# Patient Record
Sex: Male | Born: 1965 | Race: Black or African American | Hispanic: No | Marital: Single | Smoking: Never smoker
Health system: Southern US, Community
[De-identification: ages and names within clinical notes are randomized; demographics above are authoritative.]

## PROBLEM LIST (undated history)

## (undated) DIAGNOSIS — R112 Nausea with vomiting, unspecified: Secondary | ICD-10-CM

## (undated) DIAGNOSIS — Z9889 Other specified postprocedural states: Secondary | ICD-10-CM

---

## 2001-09-04 ENCOUNTER — Emergency Department (HOSPITAL_COMMUNITY): Admission: EM | Admit: 2001-09-04 | Discharge: 2001-09-04 | Payer: Self-pay | Admitting: *Deleted

## 2006-09-09 ENCOUNTER — Ambulatory Visit (HOSPITAL_COMMUNITY): Admission: RE | Admit: 2006-09-09 | Discharge: 2006-09-09 | Payer: Self-pay | Admitting: Urology

## 2011-08-02 ENCOUNTER — Ambulatory Visit (HOSPITAL_COMMUNITY)
Admission: RE | Admit: 2011-08-02 | Discharge: 2011-08-02 | Disposition: A | Discharge status: Home / Self Care | Payer: 59 | Source: Ambulatory Visit | Attending: Internal Medicine | Admitting: Internal Medicine

## 2011-08-02 ENCOUNTER — Other Ambulatory Visit (HOSPITAL_COMMUNITY): Payer: Self-pay | Admitting: Internal Medicine

## 2011-08-02 DIAGNOSIS — Z0000 Encounter for general adult medical examination without abnormal findings: Secondary | ICD-10-CM

## 2011-08-02 DIAGNOSIS — R0602 Shortness of breath: Secondary | ICD-10-CM

## 2012-12-17 IMAGING — CR DG CHEST 2V
2 series · 2 of 2 positions shown · non-contrast
Comparison: None.

CLINICAL DATA: Shortness of breath and congestion

CHEST - 2 VIEW

[view not recorded (1 of 2)]
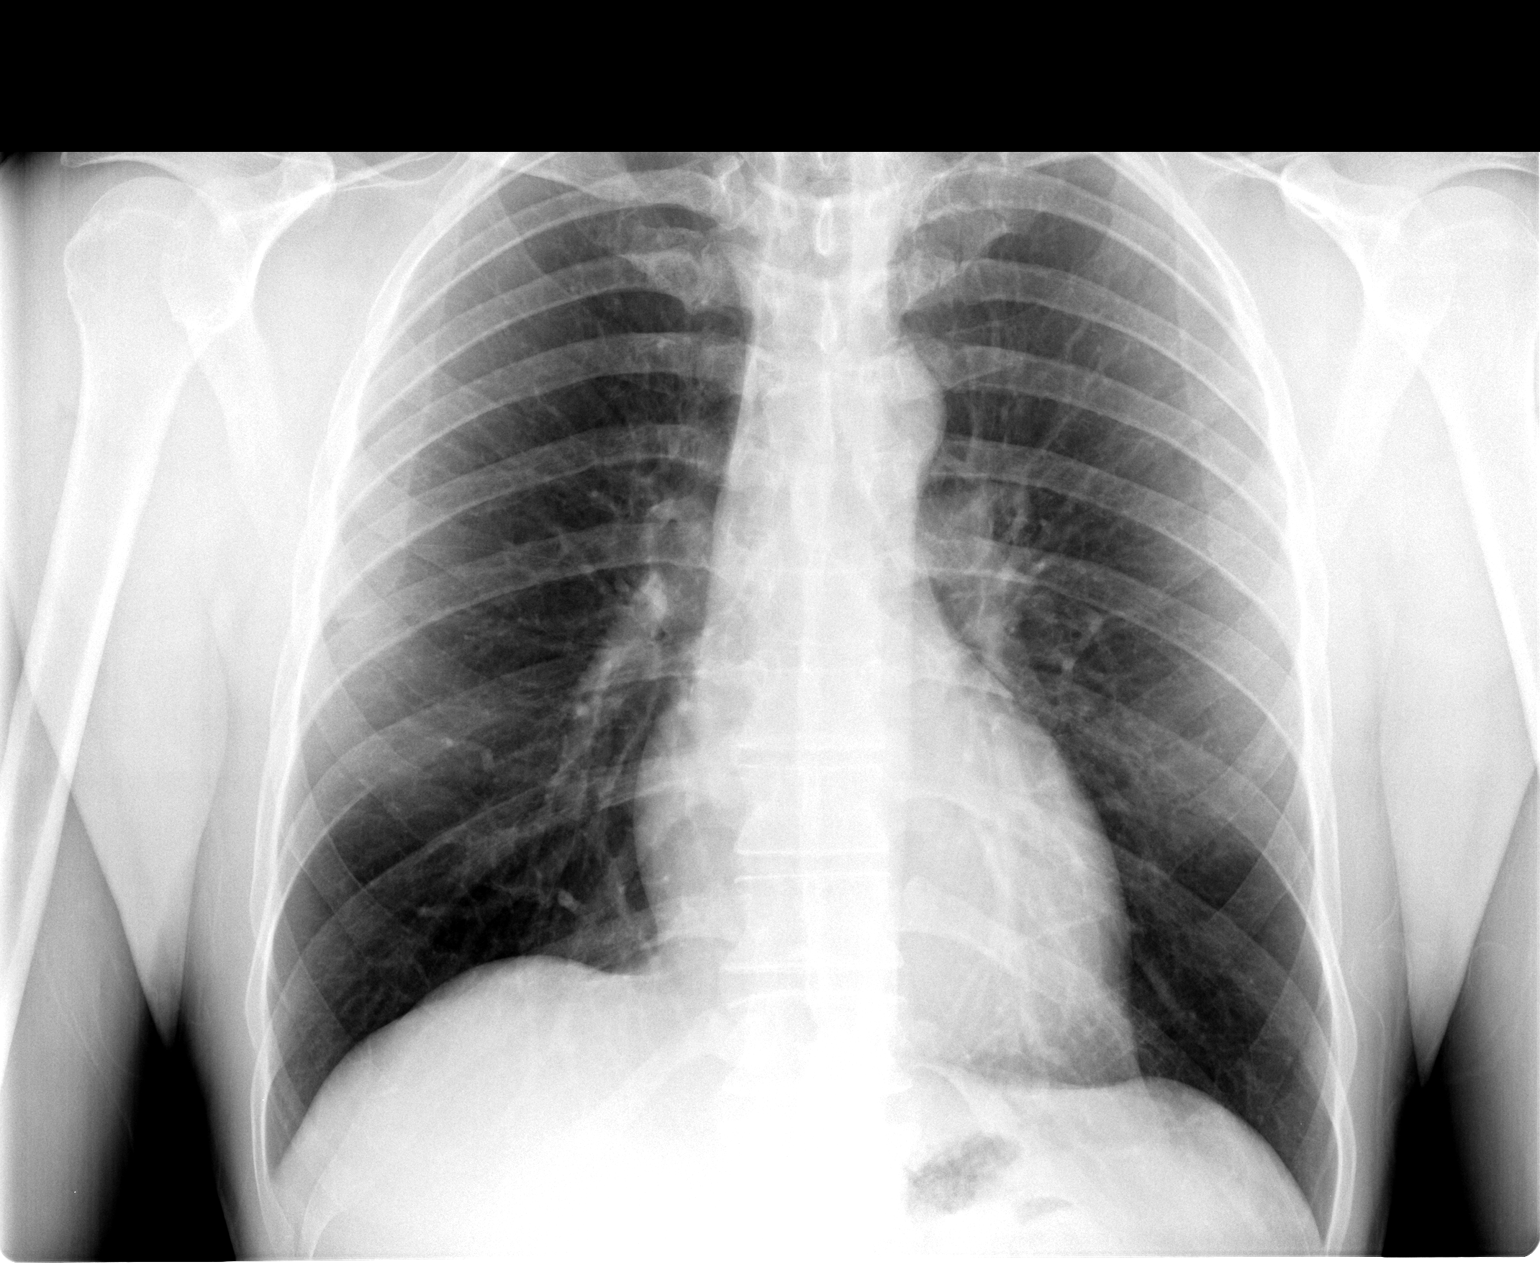

[view not recorded (2 of 2)]
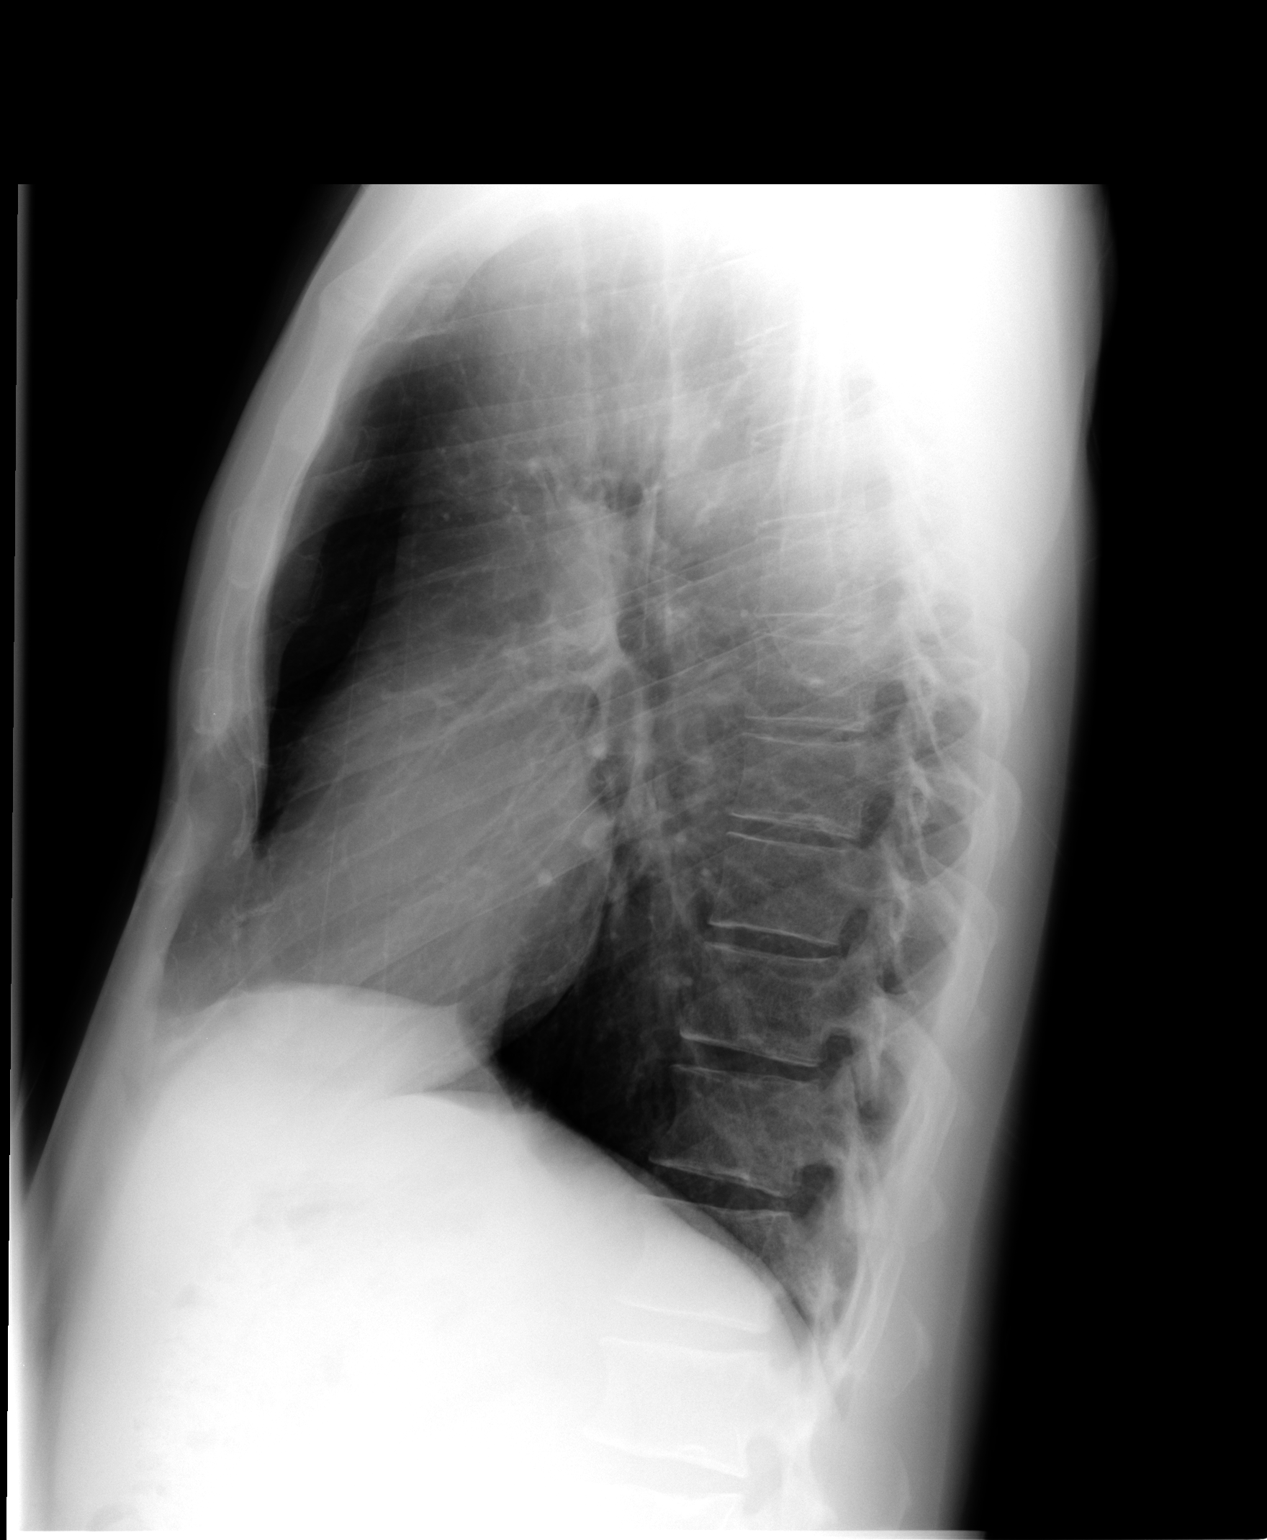

[2 of 2 positions shown; findings below may reference images not displayed]

FINDINGS: Heart and mediastinal contours are within normal limits.
The lung fields are clear with no signs of focal infiltrate or
congestive failure.  No pleural fluid or peribronchial cuffing is
seen.

Bony structures appear intact.
IMPRESSION: No worrisome focal or acute cardiopulmonary abnormality noted.

## 2015-11-17 ENCOUNTER — Encounter (INDEPENDENT_AMBULATORY_CARE_PROVIDER_SITE_OTHER): Payer: Self-pay | Admitting: *Deleted

## 2016-08-30 ENCOUNTER — Encounter (INDEPENDENT_AMBULATORY_CARE_PROVIDER_SITE_OTHER): Payer: Self-pay | Admitting: *Deleted

## 2016-08-30 ENCOUNTER — Encounter (INDEPENDENT_AMBULATORY_CARE_PROVIDER_SITE_OTHER): Payer: Self-pay

## 2018-01-23 DIAGNOSIS — Z6823 Body mass index (BMI) 23.0-23.9, adult: Secondary | ICD-10-CM | POA: Diagnosis not present

## 2018-01-23 DIAGNOSIS — J301 Allergic rhinitis due to pollen: Secondary | ICD-10-CM | POA: Diagnosis not present

## 2018-03-13 DIAGNOSIS — Z6822 Body mass index (BMI) 22.0-22.9, adult: Secondary | ICD-10-CM | POA: Diagnosis not present

## 2018-03-13 DIAGNOSIS — Z0000 Encounter for general adult medical examination without abnormal findings: Secondary | ICD-10-CM | POA: Diagnosis not present

## 2019-08-27 ENCOUNTER — Other Ambulatory Visit: Payer: Self-pay | Admitting: Internal Medicine

## 2019-08-27 ENCOUNTER — Other Ambulatory Visit: Payer: Self-pay

## 2019-08-27 DIAGNOSIS — Z20822 Contact with and (suspected) exposure to covid-19: Secondary | ICD-10-CM

## 2019-08-29 ENCOUNTER — Telehealth: Payer: Self-pay

## 2019-08-29 LAB — NOVEL CORONAVIRUS, NAA: SARS-CoV-2, NAA: NOT DETECTED

## 2019-08-29 NOTE — Telephone Encounter (Signed)
Assisted pt with MyChart enrollment. 

## 2019-09-02 LAB — NOVEL CORONAVIRUS, NAA: SARS-CoV-2, NAA: NOT DETECTED

## 2020-06-09 ENCOUNTER — Other Ambulatory Visit: Payer: Self-pay

## 2020-06-09 DIAGNOSIS — Z20822 Contact with and (suspected) exposure to covid-19: Secondary | ICD-10-CM

## 2020-06-10 LAB — SARS-COV-2, NAA 2 DAY TAT

## 2020-06-10 LAB — NOVEL CORONAVIRUS, NAA: SARS-CoV-2, NAA: NOT DETECTED

## 2021-08-21 ENCOUNTER — Encounter: Payer: Self-pay | Admitting: *Deleted

## 2021-09-06 ENCOUNTER — Other Ambulatory Visit: Payer: Self-pay

## 2021-09-06 ENCOUNTER — Ambulatory Visit (INDEPENDENT_AMBULATORY_CARE_PROVIDER_SITE_OTHER): Payer: Self-pay | Admitting: *Deleted

## 2021-09-06 ENCOUNTER — Other Ambulatory Visit: Payer: Self-pay | Admitting: *Deleted

## 2021-09-06 ENCOUNTER — Encounter: Payer: Self-pay | Admitting: *Deleted

## 2021-09-06 VITALS — Ht 70.0 in | Wt 162.8 lb

## 2021-09-06 DIAGNOSIS — Z1211 Encounter for screening for malignant neoplasm of colon: Secondary | ICD-10-CM

## 2021-09-06 MED ORDER — CLENPIQ 10-3.5-12 MG-GM -GM/160ML PO SOLN: 1.0000 | Freq: Once | ORAL | 0 refills | Status: AC

## 2021-09-06 NOTE — Progress Notes (Addendum)
Gastroenterology Pre-Procedure Review  Request Date: 09/06/2021 Requesting Physician: Cristino Martes, FNP-C, no previous TCS  PATIENT REVIEW QUESTIONS: The patient responded to the following health history questions as indicated:    1. Diabetes Melitis: no 2. Joint replacements in the past 12 months: no 3. Major health problems in the past 3 months: no 4. Has an artificial valve or MVP: no 5. Has a defibrillator: no 6. Has been advised in past to take antibiotics in advance of a procedure like teeth cleaning: no 7. Family history of colon cancer: no 8. Alcohol Use: no 9. Illicit drug Use: no 10. History of sleep apnea: no 11. History of coronary artery or other vascular stents placed within the last 12 months: no 12. History of any prior anesthesia complications: no 13. Body mass index is 23.36 kg/m.    MEDICATIONS & ALLERGIES:    Patient reports the following regarding taking any blood thinners:   Plavix? no Aspirin? no Coumadin? no Brilinta? no Xarelto? no Eliquis? no Pradaxa? no Savaysa? no Effient? no  Patient confirms/reports the following medications:  Current Outpatient Medications  Medication Sig Dispense Refill   atorvastatin (LIPITOR) 20 MG tablet Take 20 mg by mouth daily.     fexofenadine (ALLEGRA) 180 MG tablet Take 180 mg by mouth as needed for allergies or rhinitis. Rotates with Claritin     fluticasone (FLONASE) 50 MCG/ACT nasal spray Place into both nostrils as needed for allergies or rhinitis.     loratadine (CLARITIN) 10 MG tablet Take 10 mg by mouth as needed for allergies. Rotates with Allegra     sildenafil (VIAGRA) 50 MG tablet as needed.     No current facility-administered medications for this visit.    Patient confirms/reports the following allergies:  No Known Allergies  No orders of the defined types were placed in this encounter.   AUTHORIZATION INFORMATION Primary Insurance: [place],  [place] #:[id number],  Group #: [id number] Pre-Cert / Berkley Harvey  required: Yes, approved per [facility] 25/02/2705-12/08/7626 Pre-Cert / Auth #: [id number]  SCHEDULE INFORMATION: Procedure has been scheduled as follows:  Date: 09/13/2021, Time: 2:00  Location: [facility] with Dr. Jena Gauss  This Gastroenterology Pre-Precedure Review Form is being routed to the following provider(s): Tana Coast, PA-C

## 2021-09-06 NOTE — Progress Notes (Signed)
Ok to schedule conscious sedation. ASA II.  °

## 2021-09-13 ENCOUNTER — Encounter (HOSPITAL_COMMUNITY)
Admission: RE | Disposition: A | Discharge status: Home / Self Care | Payer: Self-pay | Source: Home / Self Care | Attending: Internal Medicine

## 2021-09-13 ENCOUNTER — Encounter (HOSPITAL_COMMUNITY): Payer: Self-pay | Admitting: Internal Medicine

## 2021-09-13 ENCOUNTER — Ambulatory Visit (HOSPITAL_COMMUNITY)
Admission: RE | Admit: 2021-09-13 | Discharge: 2021-09-13 | Disposition: A | Discharge status: Home / Self Care | Payer: 59 | Attending: Internal Medicine | Admitting: Internal Medicine

## 2021-09-13 ENCOUNTER — Other Ambulatory Visit: Payer: Self-pay

## 2021-09-13 DIAGNOSIS — K5730 Diverticulosis of large intestine without perforation or abscess without bleeding: Secondary | ICD-10-CM | POA: Insufficient documentation

## 2021-09-13 DIAGNOSIS — Z1211 Encounter for screening for malignant neoplasm of colon: Secondary | ICD-10-CM | POA: Diagnosis present

## 2021-09-13 HISTORY — PX: COLONOSCOPY: SHX5424

## 2021-09-13 HISTORY — DX: Other specified postprocedural states: Z98.890

## 2021-09-13 HISTORY — DX: Other specified postprocedural states: R11.2

## 2021-09-13 SURGERY — COLONOSCOPY
Anesthesia: Moderate Sedation

## 2021-09-13 MED ORDER — ONDANSETRON HCL 4 MG/2ML IJ SOLN
INTRAMUSCULAR | Status: AC
  Filled 2021-09-13: qty 2

## 2021-09-13 MED ORDER — MEPERIDINE HCL 100 MG/ML IJ SOLN
INTRAMUSCULAR | Status: DC | PRN
  Administered 2021-09-13: 15 mg via INTRAVENOUS
  Administered 2021-09-13: 25 mg via INTRAVENOUS

## 2021-09-13 MED ORDER — MIDAZOLAM HCL 5 MG/5ML IJ SOLN
INTRAMUSCULAR | Status: DC | PRN
  Administered 2021-09-13 (×2): 2 mg via INTRAVENOUS
  Administered 2021-09-13: 1 mg via INTRAVENOUS

## 2021-09-13 MED ORDER — STERILE WATER FOR IRRIGATION IR SOLN
Status: DC | PRN
  Administered 2021-09-13: 60 mL

## 2021-09-13 MED ORDER — MIDAZOLAM HCL 5 MG/5ML IJ SOLN
INTRAMUSCULAR | Status: AC
  Filled 2021-09-13: qty 10

## 2021-09-13 MED ORDER — MEPERIDINE HCL 50 MG/ML IJ SOLN
INTRAMUSCULAR | Status: AC
  Filled 2021-09-13: qty 1

## 2021-09-13 MED ORDER — SODIUM CHLORIDE 0.9 % IV SOLN
INTRAVENOUS | Status: DC
  Administered 2021-09-13: 1000 mL via INTRAVENOUS

## 2021-09-13 MED ORDER — ONDANSETRON HCL 4 MG/2ML IJ SOLN
INTRAMUSCULAR | Status: DC | PRN
  Administered 2021-09-13: 4 mg via INTRAVENOUS

## 2021-09-13 NOTE — Discharge Instructions (Signed)
  Colonoscopy Discharge Instructions  Read the instructions outlined below and refer to this sheet in the next few weeks. These discharge instructions provide you with general information on caring for yourself after you leave the hospital. Your doctor may also give you specific instructions. While your treatment has been planned according to the most current medical practices available, unavoidable complications occasionally occur. If you have any problems or questions after discharge, call Dr. Jena Gauss at [phone]. ACTIVITY You may resume your regular activity, but move at a slower pace for the next 24 hours.  Take frequent rest periods for the next 24 hours.  Walking will help get rid of the air and reduce the bloated feeling in your belly (abdomen).  No driving for 24 hours (because of the medicine (anesthesia) used during the test).   Do not sign any important legal documents or operate any machinery for 24 hours (because of the anesthesia used during the test).  NUTRITION Drink plenty of fluids.  You may resume your normal diet as instructed by your doctor.  Begin with a light meal and progress to your normal diet. Heavy or fried foods are harder to digest and may make you feel sick to your stomach (nauseated).  Avoid alcoholic beverages for 24 hours or as instructed.  MEDICATIONS You may resume your normal medications unless your doctor tells you otherwise.  WHAT YOU CAN EXPECT TODAY Some feelings of bloating in the abdomen.  Passage of more gas than usual.  Spotting of blood in your stool or on the toilet paper.  IF YOU HAD POLYPS REMOVED DURING THE COLONOSCOPY: No aspirin products for 7 days or as instructed.  No alcohol for 7 days or as instructed.  Eat a soft diet for the next 24 hours.  FINDING OUT THE RESULTS OF YOUR TEST Not all test results are available during your visit. If your test results are not back during the visit, make an appointment with your caregiver to find out the  results. Do not assume everything is normal if you have not heard from your caregiver or the medical facility. It is important for you to follow up on all of your test results.  SEEK IMMEDIATE MEDICAL ATTENTION IF: You have more than a spotting of blood in your stool.  Your belly is swollen (abdominal distention).  You are nauseated or vomiting.  You have a temperature over 101.  You have abdominal pain or discomfort that is severe or gets worse throughout the day.       No polyps found today.  You do have mild diverticulosis  Information on diverticulosis provided   a repeat colonoscopy in 10 years is recommended for screening

## 2021-09-13 NOTE — H&P (Signed)
@  WUJW@   Primary Care Physician:  Benita Stabile, MD Primary Gastroenterologist:  Dr. Jena Gauss  Pre-Procedure History & Physical: HPI:  Jeremiah Fleming is a 55 y.o. male is here for a screening colonoscopy.   No bowel symptoms.  No family history of colon cancer.  No prior colonoscopy.  No past medical history on file.  Prior to Admission medications   Medication Sig Start Date End Date Taking? Authorizing Provider  acetaminophen (TYLENOL) 500 MG tablet Take 1,000 mg by mouth every 6 (six) hours as needed (dental pain/irritation.).   Yes [provider]  atorvastatin (LIPITOR) 20 MG tablet Take 20 mg by mouth every evening. 08/11/21  Yes [provider]  fexofenadine (ALLEGRA) 180 MG tablet Take 180 mg by mouth as needed for allergies or rhinitis. Rotates with Claritin   Yes [provider]  fluticasone (FLONASE) 50 MCG/ACT nasal spray Place into both nostrils as needed for allergies or rhinitis.   Yes [provider]  loratadine (CLARITIN) 10 MG tablet Take 10 mg by mouth as needed for allergies. Rotates with Allegra   Yes [provider]  naphazoline-pheniramine (ALLERGY EYE) 0.025-0.3 % ophthalmic solution Place 1 drop into both eyes 4 (four) times daily as needed for eye irritation.    [provider]  sildenafil (VIAGRA) 50 MG tablet Take 50 mg by mouth daily as needed for erectile dysfunction. 04/08/21   [provider]    Allergies as of 09/06/2021   (No Known Allergies)    No family history on file.  Social History   Socioeconomic History   Marital status: Single    Spouse name: Not on file   Number of children: Not on file   Years of education: Not on file   Highest education level: Not on file  Occupational History   Not on file  Tobacco Use   Smoking status: Not on file   Smokeless tobacco: Not on file  Substance and Sexual Activity   Alcohol use: Not on file   Drug use: Not on file   Sexual activity: Not on  file  Other Topics Concern   Not on file  Social History Narrative   Not on file   Social Determinants of Health   Financial Resource Strain: Not on file  Food Insecurity: Not on file  Transportation Needs: Not on file  Physical Activity: Not on file  Stress: Not on file  Social Connections: Not on file  Intimate Partner Violence: Not on file    Review of Systems: See HPI, otherwise negative ROS  Physical Exam: There were no vitals taken for this visit. General:   Alert,  Well-developed, well-nourished, pleasant and cooperative in NAD Lungs:  Clear throughout to auscultation.   No wheezes, crackles, or rhonchi. No acute distress. Heart:  Regular rate and rhythm; no murmurs, clicks, rubs,  or gallops. Abdomen:  Soft, nontender and nondistended. No masses, hepatosplenomegaly or hernias noted. Normal bowel sounds, without guarding, and without rebound.   Impression/Plan: Jeremiah Fleming is now here to undergo a screening colonoscopy.    First ever average risk screening examination.  Risks, benefits, limitations, imponderables and alternatives regarding colonoscopy have been reviewed with the patient. Questions have been answered. All parties agreeable.     Notice:  This dictation was prepared with Dragon dictation along with smaller phrase technology. Any transcriptional errors that result from this process are unintentional and may not be corrected upon review.

## 2021-09-13 NOTE — Op Note (Signed)
[facility] Patient Name: Jeremiah Fleming Procedure Date: 09/13/2021 12:55 PM MRN: [id number] Date of Birth: 1965-12-09 Attending MD: Gennette Pac , MD CSN: [id number] Age: 55 Admit Type: Outpatient Procedure:                Colonoscopy Indications:              Screening for colorectal malignant neoplasm Providers:                Gennette Pac, MD, Buel Ream. Thomasena Edis RN, RN,                            Durwin Glaze Tech, Technician Referring MD:              Medicines:                Midazolam 4 mg IV, Meperidine 40 mg IV Complications:            No immediate complications. Estimated Blood Loss:     Estimated blood loss was minimal. Procedure:                Pre-Anesthesia Assessment:                           - Prior to the procedure, a History and Physical                            was performed, and patient medications and                            allergies were reviewed. The patient's tolerance of                            previous anesthesia was also reviewed. The risks                            and benefits of the procedure and the sedation                            options and risks were discussed with the patient.                            All questions were answered, and informed consent                            was obtained. Prior Anticoagulants: The patient has                            taken no previous anticoagulant or antiplatelet                            agents. ASA Grade Assessment: II - A patient with                            mild systemic disease. After reviewing the risks  and benefits, the patient was deemed in                            satisfactory condition to undergo the procedure.                           After obtaining informed consent, the colonoscope                            was passed under direct vision. Throughout the                            procedure, the patient's blood pressure, pulse,  and                            oxygen saturations were monitored continuously. The                            [id number]) scope was introduced through                            the anus and advanced to the the cecum, identified                            by appendiceal orifice and ileocecal valve. The                            colonoscopy was performed without difficulty. The                            patient tolerated the procedure well. Anatomical                            landmarks were photographed. The entire colon was                            well visualized. Scope In: 1:32:31 PM Scope Out: 1:45:27 PM Scope Withdrawal Time: 0 hours 7 minutes 34 seconds  Total Procedure Duration: 0 hours 12 minutes 56 seconds  Findings:      The perianal and digital rectal examinations were normal.      Scattered medium-mouthed diverticula were found in the sigmoid colon and       descending colon.      The exam was otherwise without abnormality on direct and retroflexion       views. Impression:               - Diverticulosis in the sigmoid colon and in the                            descending colon.                           - The examination was otherwise normal on direct  and retroflexion views.                           - No specimens collected. Moderate Sedation:      Moderate (conscious) sedation was administered by the endoscopy nurse       and supervised by the endoscopist. The following parameters were       monitored: oxygen saturation, heart rate, blood pressure, respiratory       rate, EKG, adequacy of pulmonary ventilation, and response to care.       Total physician intraservice time was 15 minutes. Recommendation:           - Patient has a contact number available for                            emergencies. The signs and symptoms of potential                            delayed complications were discussed with the                             patient. Return to normal activities tomorrow.                            Written discharge instructions were provided to the                            patient.                           - Resume previous diet.                           - Continue present medications.                           - Repeat colonoscopy in 10 years for screening                            purposes.                           - Return to GI office (date not yet determined). Procedure Code(s):        --- Professional ---                           [phone], Colonoscopy, flexible; diagnostic, including                            collection of specimen(s) by brushing or washing,                            when performed (separate procedure)                           G0500, Moderate sedation services provided by the  same physician or other qualified health care                            professional performing a gastrointestinal                            endoscopic service that sedation supports,                            requiring the presence of an independent trained                            observer to assist in the monitoring of the                            patient's level of consciousness and physiological                            status; initial 15 minutes of intra-service time;                            patient age 54 years or older (additional time may                            be reported with 41638, as appropriate) Diagnosis Code(s):        --- Professional ---                           Z12.11, Encounter for screening for malignant                            neoplasm of colon                           K57.30, Diverticulosis of large intestine without                            perforation or abscess without bleeding CPT copyright 2019 American Medical Association. All rights reserved. The codes documented in this report are preliminary and upon coder review may  be  revised to meet current compliance requirements. Gerrit Friends. Aldo Sondgeroth, MD Gennette Pac, MD 09/13/2021 1:54:42 PM This report has been signed electronically. Number of Addenda: 0

## 2021-09-18 ENCOUNTER — Encounter (HOSPITAL_COMMUNITY): Payer: Self-pay | Admitting: Internal Medicine

## 2023-03-28 ENCOUNTER — Ambulatory Visit (HOSPITAL_COMMUNITY): Payer: 59 | Attending: Family Medicine | Admitting: Occupational Therapy

## 2023-03-28 ENCOUNTER — Other Ambulatory Visit: Payer: Self-pay

## 2023-03-28 DIAGNOSIS — M25512 Pain in left shoulder: Secondary | ICD-10-CM | POA: Insufficient documentation

## 2023-03-28 DIAGNOSIS — G8929 Other chronic pain: Secondary | ICD-10-CM | POA: Diagnosis present

## 2023-03-28 DIAGNOSIS — M25612 Stiffness of left shoulder, not elsewhere classified: Secondary | ICD-10-CM | POA: Diagnosis present

## 2023-03-28 DIAGNOSIS — R29898 Other symptoms and signs involving the musculoskeletal system: Secondary | ICD-10-CM | POA: Insufficient documentation

## 2023-03-28 NOTE — Patient Instructions (Signed)

## 2023-03-28 NOTE — Therapy (Signed)
OUTPATIENT OCCUPATIONAL THERAPY ORTHO EVALUATION  Patient Name: Jeremiah Fleming MRN: [id number] DOB:10-18-1966, 57 y.o., male Today's Date: 03/28/2023  PCP: Nita Sells, MD REFERRING PROVIDER: Lupita Raider, NP  END OF SESSION:  OT End of Session - 03/28/23 1814     Visit Number 1    Number of Visits 7    Date for OT Re-Evaluation 05/10/23    Authorization Type [employer], copay $30    OT Start Time 1525    OT Stop Time 1604    OT Time Calculation (min) 39 min    Activity Tolerance Patient tolerated treatment well    Behavior During Therapy WFL for tasks assessed/performed             Past Medical History:  Diagnosis Date   PONV (postoperative nausea and vomiting)    Past Surgical History:  Procedure Laterality Date   COLONOSCOPY N/A 09/13/2021   Procedure: COLONOSCOPY;  Surgeon: Corbin Ade, MD;  Location: [facility];  Service: Endoscopy;  Laterality: N/A;  2:00   There are no problems to display for this patient.   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 Has not followed up with an orthopedic yet. No significant PMH.   PRECAUTIONS: None  WEIGHT BEARING RESTRICTIONS: No  PAIN:  Are you having pain? No  FALLS: Has patient fallen in last 6 months? No  LIVING ENVIRONMENT: Lives with: lives alone Lives in: House/apartment  PLOF: Independent  PATIENT GOALS: Pain relief  NEXT MD VISIT: None  OBJECTIVE:   HAND DOMINANCE: Right  ADLs: Overall ADLs: Dressing and bathing is painful,  difficulty putting on socks, Lifting more than 5lbs is difficult, limiting his ability to cook, clean, or complete work related tasks.   FUNCTIONAL OUTCOME MEASURES: FOTO: 70.12  UPPER EXTREMITY ROM:     Active ROM Left eval  Shoulder flexion 132  Shoulder abduction 136  Shoulder internal rotation 90  Shoulder external rotation 35  (Blank rows = not tested)  UPPER EXTREMITY MMT:     MMT Left eval  Shoulder flexion 4+/5  Shoulder abduction 4/5  Shoulder adduction 4+/5  Shoulder extension 4/5  Shoulder internal rotation 4+/5  Shoulder external rotation 4/5  (Blank rows = not tested)  SENSATION: WFL  EDEMA: No swelling noted  OBSERVATIONS: moderate fascial restrictions noted in the biceps, scapula region, and trapezius   TODAY'S TREATMENT:  DATE: 03/28/23: Evaluation Only    PATIENT EDUCATION: Education details: A/ROM Person educated: Patient Education method: Programmer, multimedia, Facilities manager, and Handouts Education comprehension: verbalized understanding and returned demonstration  HOME EXERCISE PROGRAM: 5/23: A/ROM  GOALS: Goals reviewed with patient? Yes  SHORT TERM GOALS: Target date: 05/10/23  Pt will be provided and educated on HEP for LUE shoulder mobility during ADL and IADL completion.   Goal status: INITIAL  2.  Pt will decrease pain in LUE to 2/10 on average in order to sleep 3+ consecutive hours without waking due to pain.   Goal status: INITIAL  3.  Pt will decrease fascial restrictions in LUE to trace amounts in order to complete overhead reaching tasks.   Goal status: INITIAL  4.  Pt will increase A/ROM to 155+ in flexion and abduction, as well as 45+ in external rotation in order to reach overhead and behind back during dressing and bathing tasks with no limitations.   Goal status: INITIAL  5.  Pt will increase LUE  strength to 5/5 in order to complete lifting task required during meal preparation/housework/yard work.   Goal status: INITIAL   ASSESSMENT:  CLINICAL IMPRESSION: Patient is a 57 y.o. male who was seen today for occupational therapy evaluation for chronic left shoulder pain. Pt reports this pain is limiting him with ADL's and IADL's, specifically when resistance or weight is applied.    PERFORMANCE DEFICITS: in functional skills including ADLs, IADLs, ROM, strength, pain, fascial restrictions, Gross motor control, body mechanics, and UE functional use.  IMPAIRMENTS: are limiting patient from ADLs, IADLs, rest and sleep, work, leisure, and social participation.   COMORBIDITIES: has no other co-morbidities that affects occupational performance. Patient will benefit from skilled OT to address above impairments and improve overall function.  MODIFICATION OR ASSISTANCE TO COMPLETE EVALUATION: No modification of tasks or assist necessary to complete an evaluation.  OT OCCUPATIONAL PROFILE AND HISTORY: Problem focused assessment: Including review of records relating to presenting problem.  CLINICAL DECISION MAKING: LOW - limited treatment options, no task modification necessary  REHAB POTENTIAL: Excellent  EVALUATION COMPLEXITY: Low      PLAN:  OT FREQUENCY: 1x/week  OT DURATION: 6 weeks  PLANNED INTERVENTIONS: self care/ADL training, therapeutic exercise, therapeutic activity, manual therapy, passive range of motion, functional mobility training, electrical stimulation, moist heat, cryotherapy, patient/family education, coping strategies training, and DME and/or AE instructions  RECOMMENDED OTHER SERVICES: N/A  CONSULTED AND AGREED WITH PLAN OF CARE: Patient  PLAN FOR NEXT SESSION: Manual Therapy as needed, AA/ROM, A/ROM, wall slides, Isometrics, Proximal shoulder exercises   Trish Mage, OTR/L [facility] [phone] [place] Rosemarie Beath, OT 03/28/2023, 6:15  PM

## 2023-04-05 ENCOUNTER — Encounter (HOSPITAL_COMMUNITY): Payer: Self-pay | Admitting: Occupational Therapy

## 2023-04-05 ENCOUNTER — Ambulatory Visit (HOSPITAL_COMMUNITY): Payer: 59 | Admitting: Occupational Therapy

## 2023-04-05 DIAGNOSIS — M25512 Pain in left shoulder: Secondary | ICD-10-CM | POA: Diagnosis not present

## 2023-04-05 DIAGNOSIS — G8929 Other chronic pain: Secondary | ICD-10-CM

## 2023-04-05 DIAGNOSIS — R29898 Other symptoms and signs involving the musculoskeletal system: Secondary | ICD-10-CM

## 2023-04-05 DIAGNOSIS — M25612 Stiffness of left shoulder, not elsewhere classified: Secondary | ICD-10-CM

## 2023-04-05 NOTE — Therapy (Signed)
OUTPATIENT OCCUPATIONAL THERAPY ORTHO TREATMENT NOTE  Patient Name: Jeremiah Fleming MRN: [id number] DOB:Jan 17, 1966, 57 y.o., male Today's Date: 04/05/2023  PCP: Nita Sells, MD REFERRING PROVIDER: Lupita Raider, NP  END OF SESSION:  OT End of Session - 04/05/23 0818     Visit Number 2    Number of Visits 7    Date for OT Re-Evaluation 05/10/23    Authorization Type [employer], copay $30    OT Start Time 0737    OT Stop Time 0818    OT Time Calculation (min) 41 min    Activity Tolerance Patient tolerated treatment well    Behavior During Therapy [facility] for tasks assessed/performed              Past Medical History:  Diagnosis Date   PONV (postoperative nausea and vomiting)    Past Surgical History:  Procedure Laterality Date   COLONOSCOPY N/A 09/13/2021   Procedure: COLONOSCOPY;  Surgeon: Corbin Ade, MD;  [facility]: [facility];  Service: Endoscopy;  Laterality: N/A;  2:00   There are no problems to display for this patient.   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 Has not followed up with an orthopedic yet. No significant PMH.   PRECAUTIONS: None  WEIGHT BEARING RESTRICTIONS: No  PAIN:  Are you having pain? No  FALLS: Has patient fallen in last 6 months? No  LIVING ENVIRONMENT: Lives with: lives alone Lives in: House/apartment  PLOF: Independent  PATIENT GOALS: Pain relief  NEXT MD VISIT: None  OBJECTIVE:   HAND DOMINANCE: Right  ADLs: Overall ADLs: Dressing and bathing is  painful, difficulty putting on socks, Lifting more than 5lbs is difficult, limiting his ability to cook, clean, or complete work related tasks.   FUNCTIONAL OUTCOME MEASURES: FOTO: 70.12  UPPER EXTREMITY ROM:     Active ROM Left eval  Shoulder flexion 132  Shoulder abduction 136  Shoulder internal rotation 90  Shoulder external rotation 35  (Blank rows = not tested)  UPPER EXTREMITY MMT:     MMT Left eval  Shoulder flexion 4+/5  Shoulder abduction 4/5  Shoulder adduction 4+/5  Shoulder extension 4/5  Shoulder internal rotation 4+/5  Shoulder external rotation 4/5  (Blank rows = not tested)  SENSATION: WFL  EDEMA: No swelling noted  OBSERVATIONS: moderate fascial restrictions noted in the biceps, scapula region, and trapezius   TODAY'S TREATMENT:  DATE:   04/05/23 -manual therapy: myofascial release and trigger point applied to biceps, trapezius, scapula, and subscapularis to reduce fascial restrictions and pain in order to improve ROM -A/ROM: 2lb dumbbells, supine, flexion, abduction, protraction, horizontal abduction, er/IR, x10 -Wall Slides: flexion, abduction, x10 -Ball on the wall: vertical, horizontal, circles both directions, x10 each -Scapular Strengthening: green band, extension, retraction, rows, x10   PATIENT EDUCATION: Education details: Publishing rights manager Person educated: Patient Education method: Explanation, Demonstration, and Handouts Education comprehension: verbalized understanding and returned demonstration  HOME EXERCISE PROGRAM: 5/23: A/ROM 5/31: Scapular Strengthening  GOALS: Goals reviewed with patient? Yes  SHORT TERM GOALS: Target date: 05/10/23  Pt will be provided and educated on HEP for LUE shoulder mobility during ADL and IADL completion.   Goal status: IN PROGRESS  2.  Pt will decrease pain in LUE to  2/10 on average in order to sleep 3+ consecutive hours without waking due to pain.   Goal status: IN PROGRESS  3.  Pt will decrease fascial restrictions in LUE to trace amounts in order to complete overhead reaching tasks.   Goal status: IN PROGRESS  4.  Pt will increase A/ROM to 155+ in flexion and abduction, as well as 45+ in external rotation in order to reach overhead and behind back during dressing and bathing tasks with no limitations.   Goal status: IN PROGRESS  5.  Pt will increase LUE strength to 5/5 in order to complete lifting task required during meal preparation/housework/yard work.   Goal status: IN PROGRESS   ASSESSMENT:  CLINICAL IMPRESSION: Pt presenting to therapy for his first treatment session. He continues to have moderate fascial restrictions in the LUE addressed with manual therapy this session. Additionally, he continues to work on his ROM and correct movement pattern, while adding light weights for strengthening. OT initiated scapular strengthening this session for improving shoulder stability and strength from the accessory muscles. Verbal and visual cuing throughout session for positioning and technique.   PERFORMANCE DEFICITS: in functional skills including ADLs, IADLs, ROM, strength, pain, fascial restrictions, Gross motor control, body mechanics, and UE functional use.   PLAN:  OT FREQUENCY: 1x/week  OT DURATION: 6 weeks  PLANNED INTERVENTIONS: self care/ADL training, therapeutic exercise, therapeutic activity, manual therapy, passive range of motion, functional mobility training, electrical stimulation, moist heat, cryotherapy, patient/family education, coping strategies training, and DME and/or AE instructions  RECOMMENDED OTHER SERVICES: N/A  CONSULTED AND AGREED WITH PLAN OF CARE: Patient  PLAN FOR NEXT SESSION: Manual Therapy as needed, AA/ROM, A/ROM, wall slides, Isometrics, Proximal shoulder exercises   Trish Mage, OTR/L [facility]  Outpatient Rehab [phone] Shayona Hibbitts Rosemarie Beath, OT 04/05/2023, 8:19 AM

## 2023-04-05 NOTE — Patient Instructions (Signed)

## 2023-04-11 ENCOUNTER — Encounter (HOSPITAL_COMMUNITY): Payer: Self-pay | Admitting: Occupational Therapy

## 2023-04-11 ENCOUNTER — Ambulatory Visit (HOSPITAL_COMMUNITY): Payer: 59 | Attending: Family Medicine | Admitting: Occupational Therapy

## 2023-04-11 DIAGNOSIS — R29898 Other symptoms and signs involving the musculoskeletal system: Secondary | ICD-10-CM | POA: Diagnosis present

## 2023-04-11 DIAGNOSIS — M25612 Stiffness of left shoulder, not elsewhere classified: Secondary | ICD-10-CM | POA: Diagnosis present

## 2023-04-11 DIAGNOSIS — G8929 Other chronic pain: Secondary | ICD-10-CM | POA: Diagnosis present

## 2023-04-11 DIAGNOSIS — M25512 Pain in left shoulder: Secondary | ICD-10-CM | POA: Insufficient documentation

## 2023-04-11 NOTE — Therapy (Signed)
OUTPATIENT OCCUPATIONAL THERAPY ORTHO TREATMENT NOTE  Patient Name: Jeremiah Fleming MRN: [id number] DOB:January 29, 1966, 57 y.o., male Today's Date: 04/11/2023  PCP: Nita Sells, MD REFERRING PROVIDER: Lupita Raider, NP  END OF SESSION:  OT End of Session - 04/11/23 1519     Visit Number 3    Number of Visits 7    Date for OT Re-Evaluation 05/10/23    Authorization Type [employer], copay $30    OT Start Time 1429    OT Stop Time 1510    OT Time Calculation (min) 41 min    Activity Tolerance Patient tolerated treatment well    Behavior During Therapy WFL for tasks assessed/performed               Past Medical History:  Diagnosis Date   PONV (postoperative nausea and vomiting)    Past Surgical History:  Procedure Laterality Date   COLONOSCOPY N/A 09/13/2021   Procedure: COLONOSCOPY;  Surgeon: Corbin Ade, MD;  [facility];  Service: Endoscopy;  Laterality: N/A;  2:00   There are no problems to display for this patient.   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 Has not followed up with an orthopedic yet. No significant PMH.   PRECAUTIONS: None  WEIGHT BEARING RESTRICTIONS: No  PAIN:  Are you having pain? No  FALLS: Has patient fallen in last 6 months? No  LIVING ENVIRONMENT: Lives with: lives alone Lives in: House/apartment  PLOF: Independent  PATIENT GOALS: Pain relief  NEXT MD VISIT: None  OBJECTIVE:   HAND DOMINANCE: Right  ADLs: Overall ADLs: Dressing and bathing is painful, difficulty  putting on socks, Lifting more than 5lbs is difficult, limiting his ability to cook, clean, or complete work related tasks.   FUNCTIONAL OUTCOME MEASURES: FOTO: 70.12  UPPER EXTREMITY ROM:     Active ROM Left eval  Shoulder flexion 132  Shoulder abduction 136  Shoulder internal rotation 90  Shoulder external rotation 35  (Blank rows = not tested)  UPPER EXTREMITY MMT:     MMT Left eval  Shoulder flexion 4+/5  Shoulder abduction 4/5  Shoulder adduction 4+/5  Shoulder extension 4/5  Shoulder internal rotation 4+/5  Shoulder external rotation 4/5  (Blank rows = not tested)  OBSERVATIONS: moderate fascial restrictions noted in the biceps, scapula region, and trapezius   TODAY'S TREATMENT:  DATE:   04/11/23 -manual therapy: myofascial release and trigger point applied to biceps, trapezius, scapula, and subscapularis to reduce fascial restrictions and pain in order to improve ROM -P/ROM: supine-flexion, abduction, er, horizontal abduction, 5 reps -AA/ROM: supine-protraction, flexion, er, 10 reps -A/ROM: supine-horizontal abduction,  -Shoulder stretches: flexion, abduction, doorway stretch, cross chest stretch, er stretch, IR stretch behind back with horizontal towel, 2x20" holds -Scapular Strengthening: green band, extension, retraction, rows, x10  04/05/23 -manual therapy: myofascial release and trigger point applied to biceps, trapezius, scapula, and subscapularis to reduce fascial restrictions and pain in order to improve ROM -A/ROM: 2lb dumbbells, supine, flexion, abduction, protraction, horizontal abduction, er/IR, x10 -Wall Slides: flexion, abduction, x10 -Ball on the wall: vertical, horizontal, circles both directions, x10 each -Scapular Strengthening: green band, extension, retraction, rows, x10   PATIENT EDUCATION: Education details: reviewed  HEP Person educated: Patient Education method: Explanation, Demonstration, and Handouts Education comprehension: verbalized understanding and returned demonstration  HOME EXERCISE PROGRAM: 5/23: A/ROM 5/31: Scapular Strengthening  GOALS: Goals reviewed with patient? Yes  SHORT TERM GOALS: Target date: 05/10/23  Pt will be provided and educated on HEP for LUE shoulder mobility during ADL and IADL completion.   Goal status: IN PROGRESS  2.  Pt will decrease pain in LUE to 2/10 on average in order to sleep 3+ consecutive hours without waking due to pain.   Goal status: IN PROGRESS  3.  Pt will decrease fascial restrictions in LUE to trace amounts in order to complete overhead reaching tasks.   Goal status: IN PROGRESS  4.  Pt will increase A/ROM to 155+ in flexion and abduction, as well as 45+ in external rotation in order to reach overhead and behind back during dressing and bathing tasks with no limitations.   Goal status: IN PROGRESS  5.  Pt will increase LUE strength to 5/5 in order to complete lifting task required during meal preparation/housework/yard work.   Goal status: IN PROGRESS   ASSESSMENT:  CLINICAL IMPRESSION: Pt reports he felt like his arm was going to lock up on him at work, is wondering about needing an x-ray. Encouraged pt to discuss with MD and maybe consider a referral to an orthopedic MD. Continued with manual techniques this session, pt completing AA/ROM to allow for greater stretch using the dowel rod. Added shoulder stretches this session, pt able to achieve approximately 30% ROM for IR behind back. pt with Verbal and visual cuing throughout session for positioning and technique.   PERFORMANCE DEFICITS: in functional skills including ADLs, IADLs, ROM, strength, pain, fascial restrictions, Gross motor control, body mechanics, and UE functional use.   PLAN:  OT FREQUENCY: 1x/week  OT DURATION: 6 weeks  PLANNED INTERVENTIONS: self care/ADL training,  therapeutic exercise, therapeutic activity, manual therapy, passive range of motion, functional mobility training, electrical stimulation, moist heat, cryotherapy, patient/family education, coping strategies training, and DME and/or AE instructions  CONSULTED AND AGREED WITH PLAN OF CARE: Patient  PLAN FOR NEXT SESSION: Manual Therapy as needed, P/ROM, shoulder stretches-add to HEP, AA/ROM and A/ROM    Ezra Sites, OTR/L  [phone] 04/11/2023, 3:19 PM

## 2023-04-18 ENCOUNTER — Ambulatory Visit (HOSPITAL_COMMUNITY): Payer: 59 | Admitting: Occupational Therapy

## 2023-04-18 ENCOUNTER — Encounter (HOSPITAL_COMMUNITY): Payer: Self-pay | Admitting: Occupational Therapy

## 2023-04-18 DIAGNOSIS — R29898 Other symptoms and signs involving the musculoskeletal system: Secondary | ICD-10-CM

## 2023-04-18 DIAGNOSIS — G8929 Other chronic pain: Secondary | ICD-10-CM

## 2023-04-18 DIAGNOSIS — M25612 Stiffness of left shoulder, not elsewhere classified: Secondary | ICD-10-CM

## 2023-04-18 DIAGNOSIS — M25512 Pain in left shoulder: Secondary | ICD-10-CM | POA: Diagnosis not present

## 2023-04-18 NOTE — Patient Instructions (Signed)
  1) Flexion Wall Stretch    Face wall, place affected handon wall in front of you. Slide hand up the wall  and lean body in towards the wall. Hold for 10 seconds. Repeat 3-5 times. 1-2 times/day.     2) Towel Stretch with Internal Rotation        Gently pull up (or to the side) your affected arm  behind your back with the assist of a towel. Hold 10 seconds, repeat 3-5 times. 1-2 times/day.             3) Corner Stretch    Stand at a corner of a wall, place your arms on the walls with elbows bent. Lean into the corner until a stretch is felt along the front of your chest and/or shoulders. Hold for 10 seconds. Repeat 3-5X, 1-2 times/day.    4) Posterior Capsule Stretch    Bring the involved arm across chest. Grasp elbow and pull toward chest until you feel a stretch in the back of the upper arm and shoulder. Hold 10 seconds. Repeat 3-5X. Complete 1-2 times/day.    5) Scapular Retraction    Tuck chin back as you pinch shoulder blades together.  Hold 5 seconds. Repeat 3-5X. Complete 1-2 times/day.    6) External Rotation Stretch:     Place your affected hand on the wall with the elbow bent and gently turn your body the opposite direction until a stretch is felt. Hold 10 seconds, repeat 3-5X. Complete 1-2 times/day.     

## 2023-04-18 NOTE — Therapy (Signed)
OUTPATIENT OCCUPATIONAL THERAPY ORTHO TREATMENT NOTE  Patient Name: Jeremiah Fleming MRN: [id number] DOB:1966/06/08, 57 y.o., male Today's Date: 04/18/2023  PCP: Nita Sells, MD REFERRING PROVIDER: Lupita Raider, NP  END OF SESSION:  OT End of Session - 04/18/23 1417     Visit Number 4    Number of Visits 7    Date for OT Re-Evaluation 05/10/23    Authorization Type [employer], copay $30    OT Start Time 1348    OT Stop Time 1426    OT Time Calculation (min) 38 min    Activity Tolerance Patient tolerated treatment well    Behavior During Therapy WFL for tasks assessed/performed                Past Medical History:  Diagnosis Date   PONV (postoperative nausea and vomiting)    Past Surgical History:  Procedure Laterality Date   COLONOSCOPY N/A 09/13/2021   Procedure: COLONOSCOPY;  Surgeon: Corbin Ade, MD;  [facility];  Service: Endoscopy;  Laterality: N/A;  2:00   There are no problems to display for this patient.   ONSET DATE: ~1 year  REFERRING DIAG: L Shoulder Pain  THERAPY DIAG:  Chronic left shoulder pain  Stiffness of left shoulder, not elsewhere classified  Other symptoms and signs involving the musculoskeletal system  Rationale for Evaluation and Treatment: Rehabilitation  SUBJECTIVE:   SUBJECTIVE STATEMENT: S: It's about the same.   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 Has not followed up with an orthopedic yet. No significant PMH.   PRECAUTIONS: None  WEIGHT BEARING RESTRICTIONS: No  PAIN:  Are you having pain? No  FALLS: Has patient fallen in last 6 months? No  LIVING ENVIRONMENT: Lives with: lives alone Lives in: House/apartment  PLOF: Independent  PATIENT GOALS: Pain relief  NEXT MD VISIT: None  OBJECTIVE:   HAND DOMINANCE: Right  ADLs: Overall ADLs: Dressing and bathing is painful, difficulty putting on socks, Lifting more  than 5lbs is difficult, limiting his ability to cook, clean, or complete work related tasks.   FUNCTIONAL OUTCOME MEASURES: FOTO: 70.12  UPPER EXTREMITY ROM:     Active ROM Left eval  Shoulder flexion 132  Shoulder abduction 136  Shoulder internal rotation 90  Shoulder external rotation 35  (Blank rows = not tested)  UPPER EXTREMITY MMT:     MMT Left eval  Shoulder flexion 4+/5  Shoulder abduction 4/5  Shoulder adduction 4+/5  Shoulder extension 4/5  Shoulder internal rotation 4+/5  Shoulder external rotation 4/5  (Blank rows = not tested)  OBSERVATIONS: moderate fascial restrictions noted in the biceps, scapula region, and trapezius   TODAY'S TREATMENT:                                                                                                                              DATE:  04/18/23 -manual therapy: myofascial release and trigger point applied to biceps, trapezius, scapula, and subscapularis to reduce fascial restrictions and pain in order to improve ROM -P/ROM: supine-flexion, abduction, er, horizontal abduction, 5 reps -A/ROM: supine-protraction, flexion, er, abduction, horizontal abduction, 10 reps -Shoulder stretches: flexion, doorway stretch, cross chest stretch, er stretch, IR stretch behind back with horizontal towel, 2x20" holds -A/ROM: standing-protraction, flexion, er, abduction, horizontal abduction, 10 reps -Scapular Strengthening: green band, extension, retraction, rows, x10  04/11/23 -manual therapy: myofascial release and trigger point applied to biceps, trapezius, scapula, and subscapularis to reduce fascial restrictions and pain in order to improve ROM -P/ROM: supine-flexion, abduction, er, horizontal abduction, 5 reps -AA/ROM: supine-protraction, flexion, er, 10 reps -A/ROM: supine-horizontal abduction,  -Shoulder stretches: flexion, abduction, doorway stretch, cross chest stretch, er stretch, IR stretch behind back with horizontal towel, 2x20"  holds -Scapular Strengthening: green band, extension, retraction, rows, x10  04/05/23 -manual therapy: myofascial release and trigger point applied to biceps, trapezius, scapula, and subscapularis to reduce fascial restrictions and pain in order to improve ROM -A/ROM: 2lb dumbbells, supine, flexion, abduction, protraction, horizontal abduction, er/IR, x10 -Wall Slides: flexion, abduction, x10 -Ball on the wall: vertical, horizontal, circles both directions, x10 each -Scapular Strengthening: green band, extension, retraction, rows, x10   PATIENT EDUCATION: Education details: shoulder stretches Person educated: Patient Education method: Explanation, Demonstration, and Handouts Education comprehension: verbalized understanding and returned demonstration  HOME EXERCISE PROGRAM: 5/23: A/ROM 5/31: Scapular Strengthening 6/13: Shoulder stretches  GOALS: Goals reviewed with patient? Yes  SHORT TERM GOALS: Target date: 05/10/23  Pt will be provided and educated on HEP for LUE shoulder mobility during ADL and IADL completion.   Goal status: IN PROGRESS  2.  Pt will decrease pain in LUE to 2/10 on average in order to sleep 3+ consecutive hours without waking due to pain.   Goal status: IN PROGRESS  3.  Pt will decrease fascial restrictions in LUE to trace amounts in order to complete overhead reaching tasks.   Goal status: IN PROGRESS  4.  Pt will increase A/ROM to 155+ in flexion and abduction, as well as 45+ in external rotation in order to reach overhead and behind back during dressing and bathing tasks with no limitations.   Goal status: IN PROGRESS  5.  Pt will increase LUE strength to 5/5 in order to complete lifting task required during meal preparation/housework/yard work.   Goal status: IN PROGRESS   ASSESSMENT:  CLINICAL IMPRESSION: Pt reporting not much change, has been complete his HEP. Continued with manual techniques and passive stretching, progressed to A/ROM this  session. Pt with ROM approximately 75-80% today. Continued with shoulder stretches and added to HEP. Pt with min fatigue at end of session, no increased pain but has tightness when stretching towards end range of shoulder. Verbal and visual cuing throughout session for positioning and technique.   PERFORMANCE DEFICITS: in functional skills including ADLs, IADLs, ROM, strength, pain, fascial restrictions, Gross motor control, body mechanics, and UE functional use.   PLAN:  OT FREQUENCY: 1x/week  OT DURATION: 6 weeks  PLANNED INTERVENTIONS: self care/ADL training, therapeutic exercise, therapeutic activity, manual therapy, passive range of motion, functional mobility training, electrical stimulation, moist heat, cryotherapy, patient/family education, coping strategies training, and DME and/or AE instructions  CONSULTED AND AGREED WITH PLAN OF CARE: Patient  PLAN FOR NEXT SESSION: Manual Therapy as needed, P/ROM, A/ROM, progress to strengthening and stability as able    Ezra Sites, OTR/L  [phone] 04/18/2023, 2:27 PM

## 2023-04-25 ENCOUNTER — Encounter (HOSPITAL_COMMUNITY): Payer: 59 | Admitting: Occupational Therapy

## 2023-05-02 ENCOUNTER — Encounter (HOSPITAL_COMMUNITY): Payer: 59 | Admitting: Occupational Therapy

## 2023-05-10 ENCOUNTER — Ambulatory Visit (HOSPITAL_COMMUNITY): Payer: 59 | Attending: Family Medicine | Admitting: Occupational Therapy

## 2023-05-10 ENCOUNTER — Encounter (HOSPITAL_COMMUNITY): Payer: Self-pay | Admitting: Occupational Therapy

## 2023-05-10 DIAGNOSIS — R29898 Other symptoms and signs involving the musculoskeletal system: Secondary | ICD-10-CM | POA: Diagnosis present

## 2023-05-10 DIAGNOSIS — G8929 Other chronic pain: Secondary | ICD-10-CM | POA: Diagnosis present

## 2023-05-10 DIAGNOSIS — M25612 Stiffness of left shoulder, not elsewhere classified: Secondary | ICD-10-CM | POA: Diagnosis present

## 2023-05-10 DIAGNOSIS — M25512 Pain in left shoulder: Secondary | ICD-10-CM | POA: Insufficient documentation

## 2023-05-10 NOTE — Therapy (Signed)
OUTPATIENT OCCUPATIONAL THERAPY ORTHO REASSESSMENT & TREATMENT NOTE DISCHARGE SUMMARY  Patient Name: Jeremiah Fleming MRN: [id number] DOB:August 10, 1966, 57 y.o., male Today's Date: 05/10/2023  PCP: Nita Sells, MD REFERRING PROVIDER: Lupita Raider, NP  END OF SESSION:  OT End of Session - 05/10/23 1417     Visit Number 5    Number of Visits 7    Date for OT Re-Evaluation 05/10/23    Authorization Type [employer], copay $30    OT Start Time 1350    OT Stop Time 1410    OT Time Calculation (min) 20 min    Activity Tolerance Patient tolerated treatment well    Behavior During Therapy WFL for tasks assessed/performed                 Past Medical History:  Diagnosis Date   PONV (postoperative nausea and vomiting)    Past Surgical History:  Procedure Laterality Date   COLONOSCOPY N/A 09/13/2021   Procedure: COLONOSCOPY;  Surgeon: Corbin Ade, MD;  [facility];  Service: Endoscopy;  Laterality: N/A;  2:00   There are no problems to display for this patient.   ONSET DATE: ~1 year  REFERRING DIAG: L Shoulder Pain  THERAPY DIAG:  Chronic left shoulder pain  Stiffness of left shoulder, not elsewhere classified  Other symptoms and signs involving the musculoskeletal system  Rationale for Evaluation and Treatment: Rehabilitation  SUBJECTIVE:   SUBJECTIVE STATEMENT: S: I'm getting to where I reach back further.    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 Has not followed up with an orthopedic yet. No significant PMH.   PRECAUTIONS: None  WEIGHT BEARING RESTRICTIONS: No  PAIN:  Are you having pain? No  FALLS: Has patient fallen in last 6 months? No  LIVING ENVIRONMENT: Lives with: lives alone Lives in: House/apartment  PLOF: Independent  PATIENT GOALS: Pain relief  NEXT MD VISIT: None  OBJECTIVE:   HAND DOMINANCE: Right  ADLs: Overall ADLs: Dressing and bathing  is painful, difficulty putting on socks, Lifting more than 5lbs is difficult, limiting his ability to cook, clean, or complete work related tasks.   FUNCTIONAL OUTCOME MEASURES: FOTO: 70.12 7/5: 92/100  UPPER EXTREMITY ROM:     Active ROM Left eval Left 05/10/23  Shoulder flexion 132 155  Shoulder abduction 136 165  Shoulder internal rotation 90 90  Shoulder external rotation 35 48  (Blank rows = not tested)  UPPER EXTREMITY MMT:     MMT Left eval Left 05/10/23  Shoulder flexion 4+/5 5/5  Shoulder abduction 4/5 5/5  Shoulder adduction 4+/5 5/5  Shoulder extension 4/5 5/5  Shoulder internal rotation 4+/5 5/5  Shoulder external rotation 4/5 5/5  (Blank rows = not tested)  OBSERVATIONS: moderate fascial restrictions noted in the biceps, scapula region, and trapezius   TODAY'S TREATMENT:  DATE:  05/10/23 -Theraband strengthening: protraction, flexion, horizontal abduction, abduction, er/IR, 10 reps  04/18/23 -manual therapy: myofascial release and trigger point applied to biceps, trapezius, scapula, and subscapularis to reduce fascial restrictions and pain in order to improve ROM -P/ROM: supine-flexion, abduction, er, horizontal abduction, 5 reps -A/ROM: supine-protraction, flexion, er, abduction, horizontal abduction, 10 reps -Shoulder stretches: flexion, doorway stretch, cross chest stretch, er stretch, IR stretch behind back with horizontal towel, 2x20" holds -A/ROM: standing-protraction, flexion, er, abduction, horizontal abduction, 10 reps -Scapular Strengthening: green band, extension, retraction, rows, x10  04/11/23 -manual therapy: myofascial release and trigger point applied to biceps, trapezius, scapula, and subscapularis to reduce fascial restrictions and pain in order to improve ROM -P/ROM: supine-flexion, abduction, er, horizontal abduction, 5  reps -AA/ROM: supine-protraction, flexion, er, 10 reps -A/ROM: supine-horizontal abduction,  -Shoulder stretches: flexion, abduction, doorway stretch, cross chest stretch, er stretch, IR stretch behind back with horizontal towel, 2x20" holds -Scapular Strengthening: green band, extension, retraction, rows, x10    PATIENT EDUCATION: Education details: green theraband strengthening Person educated: Patient Education method: Explanation, Demonstration, and Handouts Education comprehension: verbalized understanding and returned demonstration  HOME EXERCISE PROGRAM: 5/23: A/ROM 5/31: Scapular Strengthening 6/13: Shoulder stretches 7/5: green theraband strengthening  GOALS: Goals reviewed with patient? Yes  SHORT TERM GOALS: Target date: 05/10/23  Pt will be provided and educated on HEP for LUE shoulder mobility during ADL and IADL completion.   Goal status: MET  2.  Pt will decrease pain in LUE to 2/10 on average in order to sleep 3+ consecutive hours without waking due to pain.   Goal status: MET  3.  Pt will decrease fascial restrictions in LUE to trace amounts in order to complete overhead reaching tasks.   Goal status: MET  4.  Pt will increase A/ROM to 155+ in flexion and abduction, as well as 45+ in external rotation in order to reach overhead and behind back during dressing and bathing tasks with no limitations.   Goal status: MET  5.  Pt will increase LUE strength to 5/5 in order to complete lifting task required during meal preparation/housework/yard work.   Goal status: MET   ASSESSMENT:  CLINICAL IMPRESSION: Reassessment completed this session, pt reports he can reach behind his back better now and is able to reach overhead at work with less difficulty. Pt has met all goals and demonstrates ROM and strength WNL, pain is minimal. Discussed HEP and updated for continued strengthening. Pt is agreeable to discharge today.   PERFORMANCE DEFICITS: in functional skills  including ADLs, IADLs, ROM, strength, pain, fascial restrictions, Gross motor control, body mechanics, and UE functional use.   PLAN:  OT FREQUENCY: 1x/week  OT DURATION: 6 weeks  PLANNED INTERVENTIONS: self care/ADL training, therapeutic exercise, therapeutic activity, manual therapy, passive range of motion, functional mobility training, electrical stimulation, moist heat, cryotherapy, patient/family education, coping strategies training, and DME and/or AE instructions  CONSULTED AND AGREED WITH PLAN OF CARE: Patient  PLAN FOR NEXT SESSION: Discharge   OCCUPATIONAL THERAPY DISCHARGE SUMMARY  Visits from Start of Care: 5  Current functional level related to goals / functional outcomes: See above. Pt has met all goals, demonstrates good ROM and strength, reports no difficulty using LUE during ADLs and work tasks.    Remaining deficits: Intermittent pain   Education / Equipment: HEP for theraband strengthening   Patient agrees to discharge. Patient goals were met. Patient is being discharged due to meeting the stated rehab goals.Marland Kitchen  Ezra Sites, OTR/L  [phone] 05/10/2023, 2:17 PM

## 2023-05-10 NOTE — Patient Instructions (Signed)

## 2023-05-16 ENCOUNTER — Encounter (HOSPITAL_COMMUNITY): Payer: 59 | Admitting: Occupational Therapy

## 2023-12-13 ENCOUNTER — Ambulatory Visit: Payer: 59 | Admitting: Allergy & Immunology

## 2023-12-13 ENCOUNTER — Other Ambulatory Visit: Payer: Self-pay

## 2023-12-13 ENCOUNTER — Encounter: Payer: Self-pay | Admitting: Allergy & Immunology

## 2023-12-13 VITALS — BP 160/100 | HR 102 | Temp 98.2°F | Resp 16 | Ht 69.0 in | Wt 158.1 lb

## 2023-12-13 DIAGNOSIS — J310 Chronic rhinitis: Secondary | ICD-10-CM

## 2023-12-13 DIAGNOSIS — R030 Elevated blood-pressure reading, without diagnosis of hypertension: Secondary | ICD-10-CM | POA: Diagnosis not present

## 2023-12-13 NOTE — Progress Notes (Signed)
 NEW PATIENT  Date of Service/Encounter:  12/13/23  Consult requested by: Jeremiah Norleen PEDLAR, MD   Assessment:   Chronic rhinitis - planning for skin testing at the next visit  GERD - possibly start reflux medications at the next visit if the testing is not remarkable  Plan/Recommendations:   1. Chronic rhinitis  - Because of insurance stipulations, we cannot do skin testing on the same day as your first visit. - We are all working to fight this, but for now we need to do two separate visits.  - We will know more after we do testing at the next visit.  - The skin testing visit can be squeezed in at your convenience.  - Then we can make a more full plan to address all of your symptoms. - Be sure to stop your antihistamines for 3 days before this appointment.  - We can discuss treatment options at that time.   2. GERD  - We might consider starting a daily reflux medication to see if this helps (if the testing is negative).  3. Return in about 1 week (around 12/20/2023) for SKIN TESTING (1-55).[name] You can have the follow up appointment with Dr. Iva or a Nurse Practicioner (our Nurse Practitioners are excellent and always have Physician oversight!).    This note in its entirety was forwarded to the Provider who requested this consultation.  Subjective:   Jeremiah Fleming is a 58 y.o. male presenting today for evaluation of  Chief Complaint  Patient presents with   Allergic Rhinitis     Sneezing, sinus infections, dry ears    Jeremiah Fleming has a history of the following: There are no active problems to display for this patient.   History obtained from: chart review and patient.  Discussed the use of AI scribe software for clinical note transcription with the patient and/or guardian, who gave verbal consent to proceed.  Jeremiah Fleming was referred by Jeremiah Norleen PEDLAR, MD.     Jeremiah Fleming is a 58 y.o. male presenting for an evaluation of environmental allergies .         Allergic  Rhinitis Symptom History: He has year round symptoms. This is mostly in the summer however. He has carpeting in the home. He has tried Nasonex without much improvement. He has a lot of mucous and throat clearing. He wants to get rid of it  and has to clear his throat constantly. He experiences year-round allergies, primarily characterized by frequent sneezing, which remain consistent regardless of his location, whether at home or at work. 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 He experiences sinus infections approximately once a year. He has used Nasonex nasal spray intermittently, but is unsure of its effectiveness.   No asthma or skin problems such as eczema. No frequent reflux or heartburn.  He has a history of elevated blood pressure noted today, but no other significant cardiovascular symptoms were discussed. He is not on a blood pressure medication at this point in time. He follows with Dr. Shona.   He has a history of chewing tobacco for a long time but denies symptoms such as sore throat or impaction while eating.   Otherwise, there is no history of other atopic diseases, including food allergies, drug allergies, stinging insect allergies, or contact dermatitis. There is no significant infectious history. Vaccinations are up to date.    Past Medical History: There are  no active problems to display for this patient.   Medication List:  Allergies as of 12/13/2023   No Known Allergies      Medication List        Accurate as of December 13, 2023  1:38 PM. If you have any questions, ask your nurse or doctor.          acetaminophen 500 MG tablet Commonly known as: TYLENOL Take 1,000 mg by mouth every 6 (six) hours as needed (dental pain/irritation.).   Allergy  Eye 0.025-0.3 % ophthalmic solution Generic drug: naphazoline-pheniramine Place 1 drop into both eyes 4 (four) times daily as needed for eye irritation.    atorvastatin 20 MG tablet Commonly known as: LIPITOR Take 20 mg by mouth every evening.   fexofenadine 180 MG tablet Commonly known as: ALLEGRA Take 180 mg by mouth as needed for allergies or rhinitis. Rotates with Claritin   fluticasone 50 MCG/ACT nasal spray Commonly known as: FLONASE Place into both nostrils as needed for allergies or rhinitis.   loratadine 10 MG tablet Commonly known as: CLARITIN Take 10 mg by mouth as needed for allergies. Rotates with Allegra   sildenafil 50 MG tablet Commonly known as: VIAGRA Take 50 mg by mouth daily as needed for erectile dysfunction.        Birth History: non-contributory  Developmental History: non-contributory  Past Surgical History: Past Surgical History:  Procedure Laterality Date   COLONOSCOPY N/A 09/13/2021   Procedure: COLONOSCOPY;  Surgeon: Shaaron Lamar HERO, MD;  Location: [facility];  Service: Endoscopy;  Laterality: N/A;  2:00     Family History: Family History  Problem Relation Age of Onset   Allergic rhinitis Neg Hx    Angioedema Neg Hx    Asthma Neg Hx    Eczema Neg Hx    Urticaria Neg Hx      Social History: [name] lives at home by himself. He lives in a house that is 60 years old.  They have gas heating and cooling.  There are dust mite covers on the bed and the pillows.  There is no tobacco exposure.  He currently works as a games developer.  He has done this for 24 years.  He is exposed to fumes, chemicals, and dust.  He does have the disel fuel additive and he does not mess with it. This definitely gets his sneezing acting up. There is no HEPA filter in the home.  They do not live near an interstate or industrial area.  He does chew tobacco.   Review of systems otherwise negative other than that mentioned in the HPI.    Objective:   Blood pressure (!) 160/100, pulse (!) 102, temperature 98.2 F (36.8 C), resp. rate 16, height 5' 9 (1.753 m), weight 158 lb 2 oz (71.7 kg), SpO2 96%. Body mass  index is 23.35 kg/m.     Physical Exam Vitals reviewed.  Constitutional:      Appearance: He is well-developed.     Comments: Smiling and friendly.   HENT:     Head: Normocephalic and atraumatic.     Right Ear: Tympanic membrane, ear canal and external ear normal. No drainage, swelling or tenderness. Tympanic membrane is not injected, scarred, erythematous, retracted or bulging.     Left Ear: Tympanic membrane, ear canal and external ear normal. No drainage, swelling or tenderness. Tympanic membrane is not injected, scarred, erythematous, retracted or bulging.     Nose: No nasal deformity, septal deviation, mucosal edema or rhinorrhea.  Right Turbinates: Enlarged, swollen and pale.     Left Turbinates: Enlarged, swollen and pale.     Right Sinus: No maxillary sinus tenderness or frontal sinus tenderness.     Left Sinus: No maxillary sinus tenderness or frontal sinus tenderness.     Mouth/Throat:     Mouth: Mucous membranes are not pale and not dry.     Pharynx: Uvula midline.  Eyes:     General:        Right eye: No discharge.        Left eye: No discharge.     Conjunctiva/sclera: Conjunctivae normal.     Right eye: Right conjunctiva is not injected. No chemosis.    Left eye: Left conjunctiva is not injected. No chemosis.    Pupils: Pupils are equal, round, and reactive to light.  Cardiovascular:     Rate and Rhythm: Normal rate and regular rhythm.     Heart sounds: Normal heart sounds.  Pulmonary:     Effort: Pulmonary effort is normal. No tachypnea, accessory muscle usage or respiratory distress.     Breath sounds: Normal breath sounds. No wheezing, rhonchi or rales.  Chest:     Chest wall: No tenderness.  Abdominal:     Tenderness: There is no abdominal tenderness. There is no guarding or rebound.  Lymphadenopathy:     Head:     Right side of head: No submandibular, tonsillar or occipital adenopathy.     Left side of head: No submandibular, tonsillar or occipital  adenopathy.     Cervical: No cervical adenopathy.  Skin:    Coloration: Skin is not pale.     Findings: No abrasion, erythema, petechiae or rash. Rash is not papular, urticarial or vesicular.  Neurological:     Mental Status: He is alert.  Psychiatric:        Behavior: Behavior is cooperative.      Diagnostic studies: deferred due to insurance stipulations that require a separate visit for testing         Marty Shaggy, MD [facility] 

## 2023-12-13 NOTE — Patient Instructions (Addendum)
 1. Chronic rhinitis  - Because of insurance stipulations, we cannot do skin testing on the same day as your first visit. - We are all working to fight this, but for now we need to do two separate visits.  - We will know more after we do testing at the next visit.  - The skin testing visit can be squeezed in at your convenience.  - Then we can make a more full plan to address all of your symptoms. - Be sure to stop your antihistamines for 3 days before this appointment.  - We can discuss treatment options at that time.   2. GERD  - We might consider starting a daily reflux medication to see if this helps (if the testing is negative).  2. Return in about 1 week (around 12/20/2023) for SKIN TESTING (1-55).[name] You can have the follow up appointment with Dr. Iva or a Nurse Practicioner (our Nurse Practitioners are excellent and always have Physician oversight!).    Please inform us  of any Emergency Department visits, hospitalizations, or changes in symptoms. Call us  before going to the ED for breathing or allergy  symptoms since we might be able to fit you in for a sick visit. Feel free to contact us  anytime with any questions, problems, or concerns.  It was a pleasure to meet you today!  Websites that have reliable patient information: 1. American Academy of Asthma, Allergy , and Immunology: www.aaaai.org 2. Food Allergy  Research and Education (FARE): foodallergy.org 3. Mothers of Asthmatics: http://www.asthmacommunitynetwork.org 4. American College of Allergy , Asthma, and Immunology: www.acaai.org      "Like" us  on Facebook and Instagram for our latest updates!      A healthy democracy works best when [employer] participate! Make sure you are registered to vote! If you have moved or changed any of your contact information, you will need to get this updated before voting! Scan the QR codes below to learn more!

## 2023-12-20 ENCOUNTER — Encounter: Payer: Self-pay | Admitting: Allergy & Immunology

## 2023-12-20 ENCOUNTER — Ambulatory Visit: Payer: 59 | Admitting: Allergy & Immunology

## 2023-12-20 DIAGNOSIS — K219 Gastro-esophageal reflux disease without esophagitis: Secondary | ICD-10-CM

## 2023-12-20 DIAGNOSIS — J3089 Other allergic rhinitis: Secondary | ICD-10-CM

## 2023-12-20 MED ORDER — LEVOCETIRIZINE DIHYDROCHLORIDE 5 MG PO TABS: 5.0000 mg | ORAL_TABLET | Freq: Every evening | ORAL | 1 refills | Status: DC

## 2023-12-20 MED ORDER — FAMOTIDINE 40 MG PO TABS: 40.0000 mg | ORAL_TABLET | Freq: Every day | ORAL | 1 refills | Status: DC

## 2023-12-20 MED ORDER — RYALTRIS 665-25 MCG/ACT NA SUSP: 2.0000 | Freq: Two times a day (BID) | NASAL | 5 refills | Status: DC | PRN

## 2023-12-20 NOTE — Progress Notes (Signed)
FOLLOW UP  Date of Service/Encounter:  12/20/23   Assessment:   Perennial allergic rhinitis (dust mites only)  Gastroesophageal reflux disease  Plan/Recommendations:   1. Chronic rhinitis  - Testing today showed: dust mites - Copy of test results provided.  - Avoidance measures provided. - Stop taking: your current nose spray - Continue with: an antihistamine daily - Start taking: Ryaltris (olopatadine/mometasone) two sprays per nostril 1-2 times daily as needed - We will send the Ryaltris to a specialty pharmacy called [facility].  - You can use an extra dose of the antihistamine, if needed, for breakthrough symptoms.  - Consider nasal saline rinses 1-2 times daily to remove allergens from the nasal cavities as well as help with mucous clearance (this is especially helpful to do before the nasal sprays are given) - Consider allergy shots as a means of long-term control OR Jeremiah Fleming (which is a daily tablet that dissolves under the tongue and provides desensitization against dust mites).  - Allergy shots "re-train" and "reset" the immune system to ignore environmental allergens and decrease the resulting immune response to those allergens (sneezing, itchy watery eyes, runny nose, nasal congestion, etc).    - Allergy shots improve symptoms in 75-85% of patients.  - We can discuss more at the next appointment if the medications are not working for you.  2. GERD  - Let's start Pepcid 40mg  daily to see if this helps with preventing the mucous production.  3. Return in about 3 months (around 03/18/2024). You can have the follow up appointment with Dr. Dellis Anes or a Nurse Practicioner (our Nurse Practitioners are excellent and always have Physician oversight!).   Subjective:   Jeremiah Fleming is a 58 y.o. male presenting today for follow up of No chief complaint on file.   Jeremiah Fleming has a history of the following: There are no active problems to display for this  patient.   History obtained from: chart review and patient.  Discussed the use of AI scribe software for clinical note transcription with the patient and/or guardian, who gave verbal consent to proceed.  Jeremiah Fleming is a 58 y.o. male presenting for skin testing. He was last seen on February 7th. We could not do testing because his insurance company does not cover testing on the same day as a New Patient visit. He has been off of all antihistamines 3 days in anticipation of the testing.   Otherwise, there have been no changes to his past medical history, surgical history, family history, or social history.    Review of systems otherwise negative other than that mentioned in the HPI.    Objective:   There were no vitals taken for this visit. There is no height or weight on file to calculate BMI.    Physical exam deferred since this was a skin testing appointment only.   Diagnostic studies:   Allergy Studies:     Airborne Adult Perc - 12/20/23 1000     Time Antigen Placed 1000    Allergen Manufacturer Greer    Location Back    Number of Test 55    Panel 1 Select    1. Control-Buffer 50% Glycerol Negative    2. Control-Histamine 2+    3. Bahia Negative    4. French Southern Territories Negative    5. Johnson Negative    6. Kentucky Blue Negative    7. Meadow Fescue Negative    8. Perennial Rye Negative    9. Timothy Negative  10. Ragweed Mix Negative    11. Cocklebur Negative    12. Plantain,  English Negative    13. Baccharis Negative    14. Dog Fennel Negative    15. Russian Thistle Negative    16. Lamb's Quarters Negative    17. Sheep Sorrell Negative    18. Rough Pigweed Negative    19. Marsh Elder, Rough Negative    20. Mugwort, Common Negative    21. Box, Elder Negative    22. Cedar, red Negative    23. Sweet Gum Negative    24. Pecan Pollen Negative    25. Pine Mix Negative    26. Walnut, Black Pollen Negative    27. Red Mulberry Negative    28. Ash Mix Negative    29.  Birch Mix Negative    30. Beech American Negative    31. Cottonwood, Guinea-Bissau Negative    32. Hickory, White Negative    33. Maple Mix Negative    34. Oak, Guinea-Bissau Mix Negative    35. Sycamore Eastern Negative    36. Alternaria Alternata Negative    37. Cladosporium Herbarum Negative    38. Aspergillus Mix Negative    39. Penicillium Mix Negative    40. Bipolaris Sorokiniana (Helminthosporium) Negative    41. Drechslera Spicifera (Curvularia) Negative    42. Mucor Plumbeus Negative    43. Fusarium Moniliforme Negative    44. Aureobasidium Pullulans (pullulara) Negative    45. Rhizopus Oryzae Negative    46. Botrytis Cinera Negative    47. Epicoccum Nigrum Negative    48. Phoma Betae Negative    49. Dust Mite Mix Negative    50. Cat Hair 10,000 BAU/ml Negative    51.  Dog Epithelia Negative    52. Mixed Feathers Negative    53. Horse Epithelia Negative    54. Cockroach, German Negative    55. Tobacco Leaf Negative             Intradermal - 12/20/23 1046     Time Antigen Placed 1047    Allergen Manufacturer Waynette Buttery    Location Arm    Number of Test 16    Intradermal Select    Control Negative    Bahia Negative    French Southern Territories Negative    Johnson Negative    7 Grass Negative    Ragweed Mix Negative    Weed Mix Negative    Tree Mix Negative    Mold 1 Negative    Mold 2 Negative    Mold 3 Negative    Mold 4 Negative    Mite Mix 3+    Cat Negative    Dog Negative    Cockroach Negative             Allergy testing results were read and interpreted by myself, documented by clinical staff.      Malachi Bonds, MD  [facility]

## 2023-12-20 NOTE — Patient Instructions (Addendum)
1. Chronic rhinitis  - Testing today showed: dust mites - Copy of test results provided.  - Avoidance measures provided. - Stop taking: your current nose spray - Continue with: an antihistamine daily - Start taking: Ryaltris (olopatadine/mometasone) two sprays per nostril 1-2 times daily as needed - We will send the Ryaltris to a specialty pharmacy called [facility].  - You can use an extra dose of the antihistamine, if needed, for breakthrough symptoms.  - Consider nasal saline rinses 1-2 times daily to remove allergens from the nasal cavities as well as help with mucous clearance (this is especially helpful to do before the nasal sprays are given) - Consider allergy shots as a means of long-term control OR Isaiah Serge (which is a daily tablet that dissolves under the tongue and provides desensitization against dust mites).  - Allergy shots "re-train" and "reset" the immune system to ignore environmental allergens and decrease the resulting immune response to those allergens (sneezing, itchy watery eyes, runny nose, nasal congestion, etc).    - Allergy shots improve symptoms in 75-85% of patients.  - We can discuss more at the next appointment if the medications are not working for you.  2. GERD  - Let's start Pepcid 40mg  daily to see if this helps with preventing the mucous production.  3. Return in about 3 months (around 03/18/2024). You can have the follow up appointment with Dr. Dellis Anes or a Nurse Practicioner (our Nurse Practitioners are excellent and always have Physician oversight!).    Please inform us of any Emergency Department visits, hospitalizations, or changes in symptoms. Call us before going to the ED for breathing or allergy symptoms since we might be able to fit you in for a sick visit. Feel free to contact us anytime with any questions, problems, or concerns.  It was a pleasure to meet you today!  Websites that have reliable patient information: 1. American Academy  of Asthma, Allergy, and Immunology: www.aaaai.org 2. Food Allergy Research and Education (FARE): foodallergy.org 3. Mothers of Asthmatics: http://www.asthmacommunitynetwork.org 4. American College of Allergy, Asthma, and Immunology: www.acaai.org      "Like" [place] on Facebook and Instagram for our latest updates!      A healthy democracy works best when [employer] participate! Make sure you are registered to vote! If you have moved or changed any of your contact information, you will need to get this updated before voting! Scan the QR codes below to learn more!        Airborne Adult Perc - 12/20/23 1000     Time Antigen Placed 1000    Allergen Manufacturer Greer    Location Back    Number of Test 55    Panel 1 Select    1. Control-Buffer 50% Glycerol Negative    2. Control-Histamine 2+    3. Bahia Negative    4. French Southern Territories Negative    5. Johnson Negative    6. Kentucky Blue Negative    7. Meadow Fescue Negative    8. Perennial Rye Negative    9. Timothy Negative    10. Ragweed Mix Negative    11. Cocklebur Negative    12. Plantain,  English Negative    13. Baccharis Negative    14. Dog Fennel Negative    15. Russian Thistle Negative    16. Lamb's Quarters Negative    17. Sheep Sorrell Negative    18. Rough Pigweed Negative    19. Marsh Elder, Rough Negative    20. Mugwort, Common  Negative    21. Box, Elder Negative    22. Cedar, red Negative    23. Sweet Gum Negative    24. Pecan Pollen Negative    25. Pine Mix Negative    26. Walnut, Black Pollen Negative    27. Red Mulberry Negative    28. Ash Mix Negative    29. Birch Mix Negative    30. Beech American Negative    31. Cottonwood, Guinea-Bissau Negative    32. Hickory, White Negative    33. Maple Mix Negative    34. Oak, Guinea-[place]issau Mix Negative    35. Sycamore Eastern Negative    36. Alternaria Alternata Negative    37. Cladosporium Herbarum Negative    38. Aspergillus Mix Negative    39. Penicillium Mix Negative     40. Bipolaris Sorokiniana (Helminthosporium) Negative    41. Drechslera Spicifera (Curvularia) Negative    42. Mucor Plumbeus Negative    43. Fusarium Moniliforme Negative    44. Aureobasidium Pullulans (pullulara) Negative    45. Rhizopus Oryzae Negative    46. Botrytis Cinera Negative    47. Epicoccum Nigrum Negative    48. Phoma Betae Negative    49. Dust Mite Mix Negative    50. Cat Hair 10,000 BAU/ml Negative    51.  Dog Epithelia Negative    52. Mixed Feathers Negative    53. Horse Epithelia Negative    54. Cockroach, German Negative    55. Tobacco Leaf Negative             Intradermal - 12/20/23 1046     Time Antigen Placed 1047    Allergen Manufacturer Waynette Buttery    Location Arm    Number of Test 16    Intradermal Select    Control Negative    Bahia Negative    French Southern Territories Negative    Johnson Negative    7 Grass Negative    Ragweed Mix Negative    Weed Mix Negative    Tree Mix Negative    Mold 1 Negative    Mold 2 Negative    Mold 3 Negative    Mold 4 Negative    Mite Mix 3+    Cat Negative    Dog Negative    Cockroach Negative             Control of Dust Mite Allergen    Dust mites play a major role in allergic asthma and rhinitis.  They occur in environments with high humidity wherever human skin is found.  Dust mites absorb humidity from the atmosphere (ie, they do not drink) and feed on organic matter (including shed human and animal skin).  Dust mites are a microscopic type of insect that you cannot see with the naked eye.  High levels of dust mites have been detected from mattresses, pillows, carpets, upholstered furniture, bed covers, clothes, soft toys and any woven material.  The principal allergen of the dust mite is found in its feces.  A gram of dust may contain 1,000 mites and 250,000 fecal particles.  Mite antigen is easily measured in the air during house cleaning activities.  Dust mites do not bite and do not cause harm to humans, other than  by triggering allergies/asthma.    Ways to decrease your exposure to dust mites in your home:  Encase mattresses, box springs and pillows with a mite-impermeable barrier or cover   Wash sheets, blankets and drapes weekly in hot water (130 F) with  detergent and dry them in a dryer on the hot setting.  Have the room cleaned frequently with a vacuum cleaner and a damp dust-mop.  For carpeting or rugs, vacuuming with a vacuum cleaner equipped with a high-efficiency particulate air (HEPA) filter.  The dust mite allergic individual should not be in a room which is being cleaned and should wait 1 hour after cleaning before going into the room. Do not sleep on upholstered furniture (eg, couches).   If possible removing carpeting, upholstered furniture and drapery from the home is ideal.  Horizontal blinds should be eliminated in the rooms where the person spends the most time (bedroom, study, television room).  Washable vinyl, roller-type shades are optimal. Remove all non-washable stuffed toys from the bedroom.  Wash stuffed toys weekly like sheets and blankets above.   Reduce indoor humidity to less than 50%.  Inexpensive humidity monitors can be purchased at most hardware stores.  Do not use a humidifier as can make the problem worse and are not recommended.

## 2024-04-23 NOTE — Patient Instructions (Incomplete)
 Allergic rhinitis Continue allergen avoidance measures directed toward dust mite as listed below Continue levocetirizine (Xyzzal) 1/2 a tablet to 1 tablet once a day if needed for runny nose or itch Continue Ryaltris  2 sprays in each nostril twice a day if needed for nasal symptoms.  In the right nostril, point the applicator out toward the right ear. In the left nostril, point the applicator out toward the left ear Consider saline nasal rinses as needed for nasal symptoms. Use this before any medicated nasal sprays for best result Consider allergen immunotherapy if your symptoms are not well-controlled with the treatment plan as listed above  Reflux Continue dietary lifestyle modifications as listed below Continue famotidine  40 mg if needed to control reflux symptoms  Your blood pressure was elevated at today's visit.  Follow-up with your primary care provider for further management of your blood pressure.  Call the clinic if this treatment plan is not working well for you.  Follow up in 1 year or sooner if needed.   Control of Dust Mite Allergen Dust mites play a major role in allergic asthma and rhinitis. They occur in environments with high humidity wherever human skin is found. Dust mites absorb humidity from the atmosphere (ie, they do not drink) and feed on organic matter (including shed human and animal skin). Dust mites are a microscopic type of insect that you cannot see with the naked eye. High levels of dust mites have been detected from mattresses, pillows, carpets, upholstered furniture, bed covers, clothes, soft toys and any woven material. The principal allergen of the dust mite is found in its feces. A gram of dust may contain 1,000 mites and 250,000 fecal particles. Mite antigen is easily measured in the air during house cleaning activities. Dust mites do not bite and do not cause harm to humans, other than by triggering allergies/asthma.  Ways to decrease your exposure to dust  mites in your home:  1. Encase mattresses, box springs and pillows with a mite-impermeable barrier or cover  2. Wash sheets, blankets and drapes weekly in hot water  (130 F) with detergent and dry them in a dryer on the hot setting.  3. Have the room cleaned frequently with a vacuum cleaner and a damp dust-mop. For carpeting or rugs, vacuuming with a vacuum cleaner equipped with a high-efficiency particulate air (HEPA) filter. The dust mite allergic individual should not be in a room which is being cleaned and should wait 1 hour after cleaning before going into the room.  4. Do not sleep on upholstered furniture (eg, couches).  5. If possible removing carpeting, upholstered furniture and drapery from the home is ideal. Horizontal blinds should be eliminated in the rooms where the person spends the most time (bedroom, study, television room). Washable vinyl, roller-type shades are optimal.  6. Remove all non-washable stuffed toys from the bedroom. Wash stuffed toys weekly like sheets and blankets above.  7. Reduce indoor humidity to less than 50%. Inexpensive humidity monitors can be purchased at most hardware stores. Do not use a humidifier as can make the problem worse and are not recommended.   Lifestyle Changes for Controlling GERD When you have GERD, stomach acid feels as if it's backing up toward your mouth. Whether or not you take medication to control your GERD, your symptoms can often be improved with lifestyle changes.   Raise Your Head Reflux is more likely to strike when you're lying down flat, because stomach fluid can flow backward more easily. Raising the head  of your bed 4-6 inches can help. To do this: Slide blocks or books under the legs at the head of your bed. Or, place a wedge under the mattress. Many foam stores can make a suitable wedge for you. The wedge should run from your waist to the top of your head. Don't just prop your head on several pillows. This increases  pressure on your stomach. It can make GERD worse.  Watch Your Eating Habits Certain foods may increase the acid in your stomach or relax the lower esophageal sphincter, making GERD more likely. It's best to avoid the following: Coffee, tea, and carbonated drinks (with and without caffeine) Fatty, fried, or spicy food Mint, chocolate, onions, and tomatoes Any other foods that seem to irritate your stomach or cause you pain  Relieve the Pressure Eat smaller meals, even if you have to eat more often. Don't lie down right after you eat. Wait a few hours for your stomach to empty. Avoid tight belts and tight-fitting clothes. Lose excess weight.  Tobacco and Alcohol Avoid smoking tobacco and drinking alcohol. They can make GERD symptoms worse.

## 2024-04-23 NOTE — Progress Notes (Signed)
 [address] [place] [address] Dept: [phone]  FOLLOW UP NOTE  Patient ID: Jeremiah Fleming, male    DOB: [birth year]  Age: 58 y.o. MRN: [id number] Date of Office Visit: 04/24/2024  Assessment  Chief Complaint: Follow-up (Allergy  medications make him sleepy)  HPI Jeremiah Fleming is a 58 year old male who presents to the clinic for a follow-up visit.  He was last seen in this clinic by Dr. Iva on 12/20/2023 for evaluation of reflux and allergic rhinitis with allergy  testing positive to dust mite.  At today's visit, he reports his allergic rhinitis has been moderately well-controlled with symptoms including occasional clear rhinorrhea and postnasal drainage.  He continues Ryaltris  about 1 day a week and occasionally takes Xyzal .  He does report that Xyzal  frequently makes him drowsy during the daytime.  He is not currently using a saline nasal rinse.  He reports that he generally begins using nasal saline rinses in the fall and  later in the summer.  His last environmental allergy  skin testing was on 12/20/2023 and was positive to dust mites.  He reports that he does not have dust mite covers on his mattress or pillow.  Reflux is reported as well-controlled with only rare incidences of heartburn.  He denies vomiting.  He continues famotidine  with very infrequently with relief of symptoms.  His current medications are listed in the chart.  Drug Allergies:  No Known Allergies  Physical Exam: BP (!) 154/98   Pulse 80   Temp 98.1 F (36.7 C)   Resp 16   Wt 163 lb 6 oz (74.1 kg)   SpO2 97%   BMI 24.13 kg/m    Physical Exam Vitals reviewed.  Constitutional:      Appearance: Normal appearance.  HENT:     Right Ear: Tympanic membrane normal.     Left Ear: Tympanic membrane normal.     Nose:     Comments: Bilateral nares edematous and pale with thin clear nasal drainage noted.  Ears normal.  Eyes normal.  Pharynx slightly erythematous with no exudate     Mouth/Throat:     Pharynx: Oropharynx is clear.   Eyes:     Conjunctiva/sclera: Conjunctivae normal.    Cardiovascular:     Rate and Rhythm: Normal rate and regular rhythm.     Heart sounds: Normal heart sounds. No murmur heard. Pulmonary:     Effort: Pulmonary effort is normal.     Breath sounds: Normal breath sounds.     Comments: Lungs clear to auscultation  Musculoskeletal:        General: Normal range of motion.     Cervical back: Normal range of motion and neck supple.   Skin:    General: Skin is warm and dry.   Neurological:     Mental Status: He is alert and oriented to person, place, and time.   Psychiatric:        Mood and Affect: Mood normal.        Behavior: Behavior normal.        Thought Content: Thought content normal.        Judgment: Judgment normal.     Assessment and Plan: 1. Perennial allergic rhinitis   2. Gastroesophageal reflux disease, unspecified whether esophagitis present   3. Elevated blood pressure reading     Patient Instructions  Allergic rhinitis Continue allergen avoidance measures directed toward dust mite as listed below Continue levocetirizine (Xyzzal) 1/2 a tablet to 1 tablet once a  day if needed for runny nose or itch Continue Ryaltris  2 sprays in each nostril twice a day if needed for nasal symptoms.  In the right nostril, point the applicator out toward the right ear. In the left nostril, point the applicator out toward the left ear Consider saline nasal rinses as needed for nasal symptoms. Use this before any medicated nasal sprays for best result Consider allergen immunotherapy if your symptoms are not well-controlled with the treatment plan as listed above  Reflux Continue dietary lifestyle modifications as listed below Continue famotidine  40 mg if needed to control reflux symptoms  Your blood pressure was elevated at today's visit.  Follow-up with your primary care provider for further management of your blood  pressure.  Call the clinic if this treatment plan is not working well for you.  Follow up in 1 year or sooner if needed.  Return in about 1 year (around 04/24/2025), or if symptoms worsen or fail to improve.    Thank you for the opportunity to care for this patient.  Please do not hesitate to contact me with questions.  Arlean Mutter, FNP [facility] 

## 2024-04-24 ENCOUNTER — Ambulatory Visit: Payer: 59 | Admitting: Family Medicine

## 2024-04-24 ENCOUNTER — Encounter: Payer: Self-pay | Admitting: Family Medicine

## 2024-04-24 VITALS — BP 154/98 | HR 80 | Temp 98.1°F | Resp 16 | Wt 163.4 lb

## 2024-04-24 DIAGNOSIS — R030 Elevated blood-pressure reading, without diagnosis of hypertension: Secondary | ICD-10-CM | POA: Diagnosis not present

## 2024-04-24 DIAGNOSIS — J3089 Other allergic rhinitis: Secondary | ICD-10-CM | POA: Diagnosis not present

## 2024-04-24 DIAGNOSIS — K219 Gastro-esophageal reflux disease without esophagitis: Secondary | ICD-10-CM | POA: Diagnosis not present

## 2024-04-24 MED ORDER — RYALTRIS 665-25 MCG/ACT NA SUSP: 2.0000 | Freq: Two times a day (BID) | NASAL | 11 refills | Status: AC | PRN

## 2024-04-24 MED ORDER — FAMOTIDINE 40 MG PO TABS: 40.0000 mg | ORAL_TABLET | Freq: Every day | ORAL | 3 refills | Status: AC | PRN

## 2024-04-24 MED ORDER — LEVOCETIRIZINE DIHYDROCHLORIDE 5 MG PO TABS: 2.5000 mg | ORAL_TABLET | Freq: Every day | ORAL | 3 refills | Status: AC | PRN

## 2024-05-11 ENCOUNTER — Other Ambulatory Visit (HOSPITAL_COMMUNITY): Payer: Self-pay

## 2025-04-30 ENCOUNTER — Ambulatory Visit: Admitting: Allergy & Immunology
